# Patient Record
Sex: Male | Born: 1975 | Race: Black or African American | Hispanic: No | Marital: Single | Smoking: Current every day smoker
Health system: Southern US, Community
[De-identification: ages and names within clinical notes are randomized; demographics above are authoritative.]

---

## 2018-09-04 ENCOUNTER — Encounter (HOSPITAL_COMMUNITY): Payer: Self-pay | Admitting: Pharmacy Technician

## 2018-09-04 ENCOUNTER — Emergency Department (HOSPITAL_COMMUNITY)
Admission: EM | Admit: 2018-09-04 | Discharge: 2018-09-04 | Disposition: A | Discharge status: Home / Self Care | Payer: Medicaid Other | Attending: Emergency Medicine | Admitting: Emergency Medicine

## 2018-09-04 ENCOUNTER — Emergency Department (HOSPITAL_COMMUNITY): Payer: Medicaid Other

## 2018-09-04 ENCOUNTER — Emergency Department (HOSPITAL_COMMUNITY)
Admission: EM | Admit: 2018-09-04 | Discharge: 2018-09-05 | Disposition: A | Discharge status: Home / Self Care | Payer: Medicaid Other | Source: Home / Self Care | Attending: Emergency Medicine | Admitting: Emergency Medicine

## 2018-09-04 DIAGNOSIS — R45851 Suicidal ideations: Secondary | ICD-10-CM | POA: Insufficient documentation

## 2018-09-04 DIAGNOSIS — R079 Chest pain, unspecified: Secondary | ICD-10-CM | POA: Diagnosis present

## 2018-09-04 DIAGNOSIS — Z046 Encounter for general psychiatric examination, requested by authority: Secondary | ICD-10-CM

## 2018-09-04 DIAGNOSIS — F329 Major depressive disorder, single episode, unspecified: Secondary | ICD-10-CM

## 2018-09-04 DIAGNOSIS — Z79899 Other long term (current) drug therapy: Secondary | ICD-10-CM

## 2018-09-04 LAB — CBC WITH DIFFERENTIAL/PLATELET
Abs Immature Granulocytes: 0.03 10*3/uL (ref 0.00–0.07)
Basophils Absolute: 0.1 10*3/uL (ref 0.0–0.1)
Basophils Relative: 1 %
Eosinophils Absolute: 0.9 10*3/uL — ABNORMAL HIGH (ref 0.0–0.5)
Eosinophils Relative: 8 %
HCT: 43 % (ref 39.0–52.0)
Hemoglobin: 13.3 g/dL (ref 13.0–17.0)
Immature Granulocytes: 0 %
Lymphocytes Relative: 31 %
Lymphs Abs: 3.3 10*3/uL (ref 0.7–4.0)
MCH: 28.1 pg (ref 26.0–34.0)
MCHC: 30.9 g/dL (ref 30.0–36.0)
MCV: 90.7 fL (ref 80.0–100.0)
Monocytes Absolute: 1.1 10*3/uL — ABNORMAL HIGH (ref 0.1–1.0)
Monocytes Relative: 11 %
Neutro Abs: 5.3 10*3/uL (ref 1.7–7.7)
Neutrophils Relative %: 49 %
Platelets: 412 10*3/uL — ABNORMAL HIGH (ref 150–400)
RBC: 4.74 MIL/uL (ref 4.22–5.81)
RDW: 13.2 % (ref 11.5–15.5)
WBC: 10.7 10*3/uL — ABNORMAL HIGH (ref 4.0–10.5)
nRBC: 0 % (ref 0.0–0.2)

## 2018-09-04 LAB — COMPREHENSIVE METABOLIC PANEL
ALT: 27 U/L (ref 0–44)
AST: 24 U/L (ref 15–41)
Albumin: 3.8 g/dL (ref 3.5–5.0)
Alkaline Phosphatase: 58 U/L (ref 38–126)
Anion gap: 11 (ref 5–15)
BUN: 10 mg/dL (ref 6–20)
CO2: 28 mmol/L (ref 22–32)
Calcium: 9.5 mg/dL (ref 8.9–10.3)
Chloride: 100 mmol/L (ref 98–111)
Creatinine, Ser: 1.03 mg/dL (ref 0.61–1.24)
GFR calc Af Amer: >60 mL/min (ref >60)
Glucose, Bld: 83 mg/dL (ref 70–99)
Potassium: 3.6 mmol/L (ref 3.5–5.1)
Sodium: 139 mmol/L (ref 135–145)
Total Bilirubin: 0.3 mg/dL (ref 0.3–1.2)
Total Protein: 7.1 g/dL (ref 6.5–8.1)

## 2018-09-04 LAB — CBC
HCT: 42.9 % (ref 39.0–52.0)
Hemoglobin: 13.3 g/dL (ref 13.0–17.0)
MCH: 28.1 pg (ref 26.0–34.0)
MCHC: 31 g/dL (ref 30.0–36.0)
MCV: 90.7 fL (ref 80.0–100.0)
PLATELETS: 442 10*3/uL — AB (ref 150–400)
RBC: 4.73 MIL/uL (ref 4.22–5.81)
RDW: 13.2 % (ref 11.5–15.5)
WBC: 11.2 10*3/uL — ABNORMAL HIGH (ref 4.0–10.5)
nRBC: 0 % (ref 0.0–0.2)

## 2018-09-04 LAB — BASIC METABOLIC PANEL
Anion gap: 14 (ref 5–15)
BUN: 10 mg/dL (ref 6–20)
CO2: 24 mmol/L (ref 22–32)
Calcium: 9.7 mg/dL (ref 8.9–10.3)
Chloride: 101 mmol/L (ref 98–111)
Creatinine, Ser: 0.95 mg/dL (ref 0.61–1.24)
GFR calc Af Amer: >60 mL/min (ref >60)
GFR calc non Af Amer: >60 mL/min (ref >60)
Glucose, Bld: 93 mg/dL (ref 70–99)
Potassium: 4.6 mmol/L (ref 3.5–5.1)
Sodium: 139 mmol/L (ref 135–145)

## 2018-09-04 LAB — ACETAMINOPHEN LEVEL: Acetaminophen (Tylenol), Serum: <10 ug/mL — ABNORMAL LOW (ref 10–30)

## 2018-09-04 LAB — RAPID URINE DRUG SCREEN, HOSP PERFORMED
Amphetamines: NOT DETECTED
Barbiturates: NOT DETECTED
Benzodiazepines: NOT DETECTED
Cocaine: POSITIVE — AB
OPIATES: NOT DETECTED
Tetrahydrocannabinol: NOT DETECTED

## 2018-09-04 LAB — SALICYLATE LEVEL

## 2018-09-04 LAB — ETHANOL

## 2018-09-04 LAB — TROPONIN I: Troponin I: <0.03 ng/mL (ref <0.03)

## 2018-09-04 MED ORDER — QUETIAPINE FUMARATE 50 MG PO TABS
300.0000 mg | ORAL_TABLET | Freq: Every day | ORAL | Status: DC
  Administered 2018-09-05: 300 mg via ORAL
  Filled 2018-09-04: qty 1

## 2018-09-04 MED ORDER — HALOPERIDOL 5 MG PO TABS
10.0000 mg | ORAL_TABLET | Freq: Every day | ORAL | Status: DC
  Administered 2018-09-05: 10 mg via ORAL
  Filled 2018-09-04: qty 2

## 2018-09-04 MED ORDER — HYDROXYZINE HCL 25 MG PO TABS
25.0000 mg | ORAL_TABLET | Freq: Four times a day (QID) | ORAL | Status: DC | PRN
  Administered 2018-09-05: 25 mg via ORAL
  Filled 2018-09-04: qty 1

## 2018-09-04 MED ORDER — BENZTROPINE MESYLATE 1 MG PO TABS
1.0000 mg | ORAL_TABLET | Freq: Two times a day (BID) | ORAL | Status: DC
  Administered 2018-09-05 (×2): 1 mg via ORAL
  Filled 2018-09-04 (×2): qty 1

## 2018-09-04 MED ORDER — NALTREXONE HCL 50 MG PO TABS
50.0000 mg | ORAL_TABLET | Freq: Every day | ORAL | Status: DC
  Administered 2018-09-05: 50 mg via ORAL
  Filled 2018-09-04: qty 1

## 2018-09-04 NOTE — ED Notes (Signed)
Patient transported to X-ray 

## 2018-09-04 NOTE — ED Provider Notes (Signed)
[facility] EMERGENCY DEPARTMENT Provider Note   CSN: [id number] Arrival date & time: 09/04/18  1611     History   Chief Complaint Chief Complaint  Patient presents with  . Chest Pain    HPI Anthony Cunningham is a 42 y.o. male.  HPI  42 year old male with chest pain.  Onset about an hour prior to arrival.  Patient states that he recently relocated from [facility] [place] to get his medications.  He was seated when he began having tightness in his chest like he has difficulty moving air.  He says the sensation was reminiscent of symptoms he had with bronchitis when he was younger.  Symptoms have since resolved.  He was dyspneic when this is going on.  No diaphoresis.  No palpitations.  He felt fine earlier in the day.  No history of any cardiac disease that he is aware of.  History reviewed. No pertinent past medical history.  There are no active problems to display for this patient.   History reviewed. No pertinent surgical history.      Home Medications    Prior to Admission medications   Not on File    Family History No family history on file.  Social History Social History   Tobacco Use  . Smoking status: Not on file  Substance Use Topics  . Alcohol use: Not on file  . Drug use: Not on file     Allergies   Ibuprofen   Review of Systems Review of Systems  All systems reviewed and negative, other than as noted in HPI.  Physical Exam Updated Vital Signs BP 113/68 (BP Location: Right Arm)   Pulse 64   Temp 98.4 F (36.9 C) (Oral)   Resp 16   Ht 5\' 9"  (1.753 m)   Wt 73 kg   SpO2 98%   BMI 23.78 kg/m   Physical Exam Vitals signs and nursing note reviewed.  Constitutional:      General: He is not in acute distress.    Appearance: He is well-developed.  HENT:     Head: Normocephalic and atraumatic.  Eyes:     General:        Right eye: No discharge.        Left eye: No discharge.     Conjunctiva/sclera: Conjunctivae  normal.  Neck:     Musculoskeletal: Neck supple.  Cardiovascular:     Rate and Rhythm: Normal rate and regular rhythm.     Heart sounds: Normal heart sounds. No murmur. No friction rub. No gallop.   Pulmonary:     Effort: Pulmonary effort is normal. No respiratory distress.     Breath sounds: Normal breath sounds.  Abdominal:     General: There is no distension.     Palpations: Abdomen is soft.     Tenderness: There is no abdominal tenderness.  Musculoskeletal:        General: No tenderness.     Comments: Lower extremities symmetric as compared to each other. No calf tenderness. Negative Homan's. No palpable cords.   Skin:    General: Skin is warm and dry.  Neurological:     Mental Status: He is alert.  Psychiatric:        Behavior: Behavior normal.        Thought Content: Thought content normal.      ED Treatments / Results  Labs (all labs ordered are listed, but only abnormal results are displayed) Labs Reviewed  CBC WITH DIFFERENTIAL/PLATELET -  Abnormal; Notable for the following components:      Result Value   WBC 10.7 (*)    Platelets 412 (*)    Monocytes Absolute 1.1 (*)    Eosinophils Absolute 0.9 (*)    All other components within normal limits  BASIC METABOLIC PANEL  TROPONIN I    EKG EKG Interpretation  Date/Time:  Friday September 04 2018 16:16:29 EST Ventricular Rate:  63 PR Interval:    QRS Duration: 94 QT Interval:  387 QTC Calculation: 397 R Axis:   68 Text Interpretation:  Sinus rhythm RSR' in V1 or V2, right VCD or RVH No old tracing to compare Confirmed by Raeford RazorKohut, Grizelda Piscopo [phone]) on 09/04/2018 4:21:26 PM   Radiology Dg Chest 2 View  Result Date: 09/04/2018 CLINICAL DATA:  Chest pain. EXAM: CHEST - 2 VIEW COMPARISON:  None. FINDINGS: The heart size and mediastinal contours are within normal limits. Both lungs are clear. The visualized skeletal structures are unremarkable. IMPRESSION: No active cardiopulmonary disease. Electronically Signed    By: Gerome Samavid  Williams III M.D   On: 09/04/2018 16:57    Procedures Procedures (including critical care time)  Medications Ordered in ED Medications - No data to display   Initial Impression / Assessment and Plan / ED Course  I have reviewed the triage vital signs and the nursing notes.  Pertinent labs & imaging results that were available during my care of the patient were reviewed by me and considered in my medical decision making (see chart for details).    [age] year old male with chest pain.  Seems atypical for ACS.  Doubt PE, dissection or other emergent process.  EKG pretty unremarkable.  Chest x-ray clear.  Basic labs/troponin normal.  It has been determined that no acute conditions requiring further emergency intervention are present at this time. The patient has been advised of the diagnosis and plan. I reviewed any labs and imaging including any potential incidental findings. We have discussed signs and symptoms that warrant return to the ED and they are listed in the discharge instructions.    Final Clinical Impressions(s) / ED Diagnoses   Final diagnoses:  Chest pain, unspecified type    ED Discharge Orders    None       Raeford RazorKohut, Annebelle Bostic, MD 09/04/18 [phone]

## 2018-09-04 NOTE — ED Notes (Signed)
Patient verbalizes understanding of discharge instructions. Opportunity for questioning and answers were provided. 

## 2018-09-04 NOTE — ED Provider Notes (Signed)
[facility] Provider Note   CSN: [id number] Arrival date & time: 09/04/18  1950     History   Chief Complaint Chief Complaint  Patient presents with  . Suicidal    HPI Anthony Cunningham is a 42 y.o. male.  The history is provided by the patient and medical records.     42 y.o. M with hx of psychiatric illness, presenting to the ED for suicidal ideation.  Patient reports this has been on and off for a few weeks but worse today.  States he has recently had to move and states that other factors in his life just seem to be "going all wrong".  He states he has been having thoughts of sitting in the road to get hit by a car.  He reports SI attempt several years ago, none recently.  He has been compliant with this medications---states initially they were helping but don't seem to help as much anymore.  He denies HI/AVH.  Denies recent drug or alcohol abuse.    No past medical history on file.  There are no active problems to display for this patient.   No past surgical history on file.      Home Medications    Prior to Admission medications   Medication Sig Start Date End Date Taking? Authorizing Provider  benztropine (COGENTIN) 1 MG tablet Take 1 mg by mouth 2 (two) times daily.   Yes [provider]  haloperidol (HALDOL) 10 MG tablet Take 10 mg by mouth at bedtime.   Yes [provider]  hydrOXYzine (VISTARIL) 25 MG capsule Take 25 mg by mouth every 6 (six) hours as needed for anxiety.   Yes [provider]  naltrexone (DEPADE) 50 MG tablet Take 50 mg by mouth daily.   Yes [provider]  QUEtiapine (SEROQUEL) 300 MG tablet Take 300 mg by mouth at bedtime.   Yes [provider]    Family History No family history on file.  Social History Social History   Tobacco Use  . Smoking status: Not on file  Substance Use Topics  . Alcohol use: Not on file  . Drug use: Not on file     Allergies    Ibuprofen   Review of Systems Review of Systems  Psychiatric/Behavioral: Positive for suicidal ideas.  All other systems reviewed and are negative.    Physical Exam Updated Vital Signs BP 117/78 (BP Location: Left Arm)   Pulse 78   Temp 98.2 F (36.8 C) (Oral)   Resp 16   SpO2 100%   Physical Exam Vitals signs and nursing note reviewed.  Constitutional:      Appearance: He is well-developed.  HENT:     Head: Normocephalic and atraumatic.  Eyes:     Conjunctiva/sclera: Conjunctivae normal.     Pupils: Pupils are equal, round, and reactive to light.  Neck:     Musculoskeletal: Normal range of motion.  Cardiovascular:     Rate and Rhythm: Normal rate and regular rhythm.     Heart sounds: Normal heart sounds.  Pulmonary:     Effort: Pulmonary effort is normal.     Breath sounds: Normal breath sounds.  Abdominal:     General: Bowel sounds are normal.     Palpations: Abdomen is soft.  Musculoskeletal: Normal range of motion.  Skin:    General: Skin is warm and dry.  Neurological:     Mental Status: He is alert and oriented to person, place,  and time.  Psychiatric:        Attention and Perception: He does not perceive auditory hallucinations.        Thought Content: Thought content includes suicidal ideation. Thought content does not include homicidal ideation. Thought content includes suicidal plan. Thought content does not include homicidal plan.      ED Treatments / Results  Labs (all labs ordered are listed, but only abnormal results are displayed) Labs Reviewed  ACETAMINOPHEN LEVEL - Abnormal; Notable for the following components:      Result Value   Acetaminophen (Tylenol), Serum <10 (*)    All other components within normal limits  CBC - Abnormal; Notable for the following components:   WBC 11.2 (*)    Platelets 442 (*)    All other components within normal limits  RAPID URINE DRUG SCREEN, HOSP PERFORMED - Abnormal; Notable for the following  components:   Cocaine POSITIVE (*)    All other components within normal limits  COMPREHENSIVE METABOLIC PANEL  ETHANOL  SALICYLATE LEVEL    EKG None  Radiology Dg Chest 2 View  Result Date: 09/04/2018 CLINICAL DATA:  Chest pain. EXAM: CHEST - 2 VIEW COMPARISON:  None. FINDINGS: The heart size and mediastinal contours are within normal limits. Both lungs are clear. The visualized skeletal structures are unremarkable. IMPRESSION: No active cardiopulmonary disease. Electronically Signed   By: Gerome Sam III M.D   On: 09/04/2018 16:57    Procedures Procedures (including critical care time)  Medications Ordered in ED Medications  benztropine (COGENTIN) tablet 1 mg (1 mg Oral Given 09/05/18 0005)  haloperidol (HALDOL) tablet 10 mg (10 mg Oral Given 09/05/18 0005)  hydrOXYzine (ATARAX/VISTARIL) tablet 25 mg (25 mg Oral Given 09/05/18 0236)  QUEtiapine (SEROQUEL) tablet 300 mg (300 mg Oral Given 09/05/18 0005)  naltrexone (DEPADE) tablet 50 mg (has no administration in time range)     Initial Impression / Assessment and Plan / ED Course  I have reviewed the triage vital signs and the nursing notes.  Pertinent labs & imaging results that were available during my care of the patient were reviewed by me and considered in my medical decision making (see chart for details).  42 year old male here with suicidal ideation.  Reports he has been depressed recently and is now having thoughts of sitting in traffic in hopes of being killed by a car.  Does report history of suicide attempt in the past.  States he is under a lot of stress and a lot of things in his life are "going all wrong".  He denies any homicidal ideation.  No hallucinations.  Denies any significant substance abuse.  Labs are overall reassuring, UDS is positive for cocaine.  Patient medically cleared.  Awaiting TTS evaluation.  TTS has evaluated.  Recommends overnight observation and reassessment by psychiatry in the  morning.  Home meds have been ordered.  Patient has been resting comfortably, no acute events.  Final Clinical Impressions(s) / ED Diagnoses   Final diagnoses:  Suicidal ideation    ED Discharge Orders    None       Garlon Hatchet, PA-C 09/05/18 1610    Blane Ohara, MD 09/06/18 [phone]

## 2018-09-04 NOTE — ED Notes (Signed)
No sitter available per staffing office.  

## 2018-09-04 NOTE — ED Triage Notes (Signed)
Pt complaining of thoughts of wanting to harm himself and others. States he does has have a plan for attempting suicide. Says he's been having these thoughts for several days. Has a history of attempting suicide before

## 2018-09-04 NOTE — ED Triage Notes (Signed)
[facility] of sudden onset of CP approx 1 hour ago. 115/74, RR 18, HR 60. Pt with recent move here. Reports increased stress levels. 324mg  ASA and 1 SL nitroglycerin given by ems.

## 2018-09-05 ENCOUNTER — Encounter: Payer: Self-pay | Admitting: Medical

## 2018-09-05 ENCOUNTER — Encounter (HOSPITAL_COMMUNITY): Payer: Self-pay

## 2018-09-05 ENCOUNTER — Other Ambulatory Visit: Payer: Self-pay

## 2018-09-05 ENCOUNTER — Emergency Department (HOSPITAL_COMMUNITY)
Admission: EM | Admit: 2018-09-05 | Discharge: 2018-09-07 | Disposition: A | Discharge status: Home / Self Care | Payer: Medicaid Other | Attending: Psychiatry | Admitting: Psychiatry

## 2018-09-05 DIAGNOSIS — F329 Major depressive disorder, single episode, unspecified: Secondary | ICD-10-CM | POA: Insufficient documentation

## 2018-09-05 DIAGNOSIS — Z046 Encounter for general psychiatric examination, requested by authority: Secondary | ICD-10-CM | POA: Diagnosis not present

## 2018-09-05 DIAGNOSIS — R45851 Suicidal ideations: Secondary | ICD-10-CM | POA: Diagnosis not present

## 2018-09-05 DIAGNOSIS — F1420 Cocaine dependence, uncomplicated: Secondary | ICD-10-CM | POA: Insufficient documentation

## 2018-09-05 DIAGNOSIS — F1994 Other psychoactive substance use, unspecified with psychoactive substance-induced mood disorder: Secondary | ICD-10-CM | POA: Insufficient documentation

## 2018-09-05 DIAGNOSIS — F17200 Nicotine dependence, unspecified, uncomplicated: Secondary | ICD-10-CM | POA: Insufficient documentation

## 2018-09-05 DIAGNOSIS — F419 Anxiety disorder, unspecified: Secondary | ICD-10-CM | POA: Insufficient documentation

## 2018-09-05 DIAGNOSIS — R441 Visual hallucinations: Secondary | ICD-10-CM | POA: Diagnosis not present

## 2018-09-05 DIAGNOSIS — Z8659 Personal history of other mental and behavioral disorders: Secondary | ICD-10-CM | POA: Insufficient documentation

## 2018-09-05 DIAGNOSIS — F209 Schizophrenia, unspecified: Secondary | ICD-10-CM | POA: Insufficient documentation

## 2018-09-05 DIAGNOSIS — R45850 Homicidal ideations: Secondary | ICD-10-CM | POA: Insufficient documentation

## 2018-09-05 DIAGNOSIS — Z79899 Other long term (current) drug therapy: Secondary | ICD-10-CM | POA: Insufficient documentation

## 2018-09-05 MED ORDER — NALTREXONE HCL 50 MG PO TABS
50.0000 mg | ORAL_TABLET | Freq: Every day | ORAL | Status: DC
  Administered 2018-09-05 – 2018-09-07 (×2): 50 mg via ORAL
  Filled 2018-09-05 (×4): qty 1

## 2018-09-05 MED ORDER — HYDROXYZINE HCL 25 MG PO TABS
25.0000 mg | ORAL_TABLET | Freq: Four times a day (QID) | ORAL | Status: DC | PRN
  Administered 2018-09-05: 25 mg via ORAL
  Filled 2018-09-05: qty 1

## 2018-09-05 MED ORDER — QUETIAPINE FUMARATE 50 MG PO TABS
300.0000 mg | ORAL_TABLET | Freq: Every day | ORAL | Status: DC
  Administered 2018-09-05 – 2018-09-06 (×2): 300 mg via ORAL
  Filled 2018-09-05 (×2): qty 2

## 2018-09-05 MED ORDER — BENZTROPINE MESYLATE 1 MG PO TABS
1.0000 mg | ORAL_TABLET | Freq: Two times a day (BID) | ORAL | Status: DC
  Administered 2018-09-05 – 2018-09-07 (×4): 1 mg via ORAL
  Filled 2018-09-05 (×4): qty 1

## 2018-09-05 MED ORDER — HALOPERIDOL 5 MG PO TABS
10.0000 mg | ORAL_TABLET | Freq: Every day | ORAL | Status: DC
  Administered 2018-09-05 – 2018-09-06 (×2): 10 mg via ORAL
  Filled 2018-09-05 (×2): qty 2

## 2018-09-05 MED ORDER — HYDROXYZINE PAMOATE 25 MG PO CAPS: 25.0000 mg | ORAL_CAPSULE | Freq: Four times a day (QID) | ORAL | Status: DC | PRN

## 2018-09-05 NOTE — ED Notes (Signed)
Telepsych being performed. 

## 2018-09-05 NOTE — ED Notes (Signed)
Pt woke - ate breakfast. Aware NP, [facility], will be assessing him later today. Pt states he is chronically SI. States has attempted to hurt himself in past. Pt lying on stretcher watching tv. Voiced understanding of Medical Clearance Pt Policy.
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
## 2018-09-05 NOTE — BH Assessment (Signed)
Tele Assessment Note   Patient Name: Anthony Cunningham MRN: [id number] Referring Physician: DR. Jodi MourningZavitz Location of Patient: [facility] H014C [facility]  Anthony Sohooby Malay is an 42 y.o., single male. [facility]. Pt reports having SI with a plan to sit in traffic. Pt seemed very nonchalant and was the only historian for this assessment. Pt reports having tried to commit suicide 7 times in the past. Pt endorses the following symptoms: feelings of hopelessness, despondence, loss of interest, isolation and trouble sleeping. Pt reports poor appetite. Pt denied AH, but reports that he sees spots. Pt reports no use of substances in 2 /months, but pt is positive for Cocaine. Pt reports being prescribed Haldol, Seroquel, Trazodone and Depakote by a previous doctor. [facility]. Pt reports having completed an assessment, as well as saw a doctor for MM. Pt denies HI.   Pt states that he lives in a rooming house that he moved in 2 days ago. Pt reports receiving MCD and SSI. Pt reports having no supports in this county. [place]. Pt reports no pending charges or probation. Pt reports no major medical issues.   Pt oriented to person, place, time and situation. Pt presented mostly alert, dressed appropriately in scrubs and groomed. Pt spoke with slurred speech. Pt did seem to be under the influence of any substances. Pt made decemt eye contact and answered questions appropriately. Pt presented with flat affect, and was calm and open to the assessment process. Pt presented with no impairments of remote or recent memory that could be detected seeing that he was the only historian.   Diagnosis: Per History:  F20.9 Schizophrenia F14.20 Cocaine use disorder, Moderate  Past Medical History: No past medical history on file.  No past surgical history on file.  Family History: No family history on  file.  Social History:  has no history on file for tobacco, alcohol, and drug.  Additional Social History:  Alcohol / Drug Use Pain Medications: SEE MAR.  Prescriptions: Pt reports being prescribed seroquel, haldol, depakote, trazodone.  Over the Counter: SEE MAR.  History of alcohol / drug use?: Yes Longest period of sobriety (when/how long): Pt reports 2 months.  Substance #1 Name of Substance 1: (S) Cocaine (PT denies use in 2 months, but is positive on UDS. )  CIWA: CIWA-Ar BP: 117/78 Pulse Rate: 78 COWS:    Allergies:  Allergies  Allergen Reactions  . Ibuprofen Hives    Home Medications: (Not in a hospital admission)   OB/GYN Status:  No LMP for male patient./  General Assessment Data [facility] TTS Assessment: In system Is this a Tele or Face-to-Face Assessment?: Tele Assessment Is this an Initial Assessment or a Re-assessment for this encounter?: Initial Assessment Patient Accompanied by:: N/A(Pt alone. ) Language Other than English: No Living Arrangements: Other (Comment)(Pt reports living in a rooming house. ) What gender do you identify as?: Male Marital status: Single Maiden name: N/A Pregnancy Status: No Living Arrangements: Non-relatives/Friends Can pt return to current living arrangement?: Yes Admission Status: Voluntary Is patient capable of signing voluntary admission?: Yes Referral Source: Self/Family/Friend Insurance type: Medicaid   Medical Screening Exam [facility] Walk-in ONLY) Medical Exam completed: Yes  Crisis Care Plan Living Arrangements: Non-relatives/Friends [facility]  [facility]   Education Status Is patient currently in school?: No Is the patient employed, unemployed or receiving disability?: Receiving disability  income  Risk to self with the past 6 months Suicidal Ideation: Yes-Currently Present Has patient been a risk to self within the past 6 months prior to admission? :  Yes Suicidal Intent: Yes-Currently Present Has patient had any suicidal intent within the past 6 months prior to admission? : Yes Is patient at risk for suicide?: Yes Suicidal Plan?: Yes-Currently Present Has patient had any suicidal plan within the past 6 months prior to admission? : Yes Specify Current Suicidal Plan: "Sit in traffic."  Access to Means: Yes Specify Access to Suicidal Means: Pt reports having access to roadways.  What has been your use of drugs/alcohol within the last 12 months?: Pt denies use in 2 weeks.  Previous Attempts/Gestures: Yes How many times?: 7 Other Self Harm Risks: Pt denied.  Triggers for Past Attempts: Unpredictable Intentional Self Injurious Behavior: None Family Suicide History: No Recent stressful life event(s): Other (Comment)(Pt reports recent move from [facility]. ) Persecutory voices/beliefs?: No Depression: Yes Depression Symptoms: Feeling worthless/self pity, Loss of interest in usual pleasures, Fatigue, Despondent Substance abuse history and/or treatment for substance abuse?: Yes Suicide prevention information given to non-admitted patients: Not applicable  Risk to Others within the past 6 months Homicidal Ideation: No Does patient have any lifetime risk of violence toward others beyond the six months prior to admission? : No Thoughts of Harm to Others: No Current Homicidal Intent: No Current Homicidal Plan: No Access to Homicidal Means: No Identified Victim: Denied History of harm to others?: No Assessment of Violence: None Noted Violent Behavior Description: Denied Does patient have access to weapons?: No Criminal Charges Pending?: No Does patient have a court date: No Is patient on probation?: No  Psychosis Hallucinations: Visual("I'm seeing spots." ) Delusions: None noted  Mental Status Report Appearance/Hygiene: In scrubs, Unremarkable Eye Contact: Good Motor Activity: Freedom of movement Speech: Logical/coherent Level  of Consciousness: Quiet/awake Mood: Depressed Affect: Depressed Anxiety Level: None Thought Processes: Coherent, Relevant Judgement: Impaired Orientation: Person, Time, Situation Obsessive Compulsive Thoughts/Behaviors: None  Cognitive Functioning Concentration: Normal Memory: Recent Intact, Remote Intact Is patient IDD: No Insight: Poor Impulse Control: Poor Appetite: Fair Have you had any weight changes? : No Change Sleep: Decreased Total Hours of Sleep: 4 Vegetative Symptoms: None  ADLScreening [facility] Assessment Services) Patient's cognitive ability adequate to safely complete daily activities?: Yes Patient able to express need for assistance with ADLs?: Yes Independently performs ADLs?: Yes (appropriate for developmental age)  Prior Inpatient Therapy Prior Inpatient Therapy: Yes Prior Therapy Dates: 2019 Prior Therapy Facilty/Provider(s): [facility] Reason for Treatment: Psychosis; SI  Prior Outpatient Therapy Prior Outpatient Therapy: Yes Prior Therapy Dates: 2019 Prior Therapy Facilty/Provider(s): [facility]; [place]  Reason for Treatment: SI; Psychosis Does patient have an ACCT team?: No Does patient have Intensive In-House Services?  : No Does patient have [facility] services? : Yes Does patient have P4CC services?: No  ADL Screening (condition at time of admission) Patient's cognitive ability adequate to safely complete daily activities?: Yes Is the patient deaf or have difficulty hearing?: No Does the patient have difficulty seeing, even when wearing glasses/contacts?: No Does the patient have difficulty concentrating, remembering, or making decisions?: No Patient able to express need for assistance with ADLs?: Yes Independently performs ADLs?: Yes (appropriate for developmental age) Does the patient have difficulty walking or climbing stairs?: No Weakness of Legs: None Weakness of Arms/Hands: None  Home Assistive Devices/Equipment Home  Assistive Devices/Equipment: None  Therapy Consults (therapy consults require a physician order) PT Evaluation Needed: No OT  Evalulation Needed: No SLP Evaluation Needed: No Abuse/Neglect Assessment (Assessment to be complete while patient is alone) Abuse/Neglect Assessment Can Be Completed: Yes Physical Abuse: Denies Verbal Abuse: Denies Sexual Abuse: Denies Exploitation of patient/patient's resources: Denies Self-Neglect: Denies Values / Beliefs Cultural Requests During Hospitalization: None Consults Spiritual Care Consult Needed: No Social Work Consult Needed: No Merchant navy officer (For Healthcare) Does Patient Have a Medical Advance Directive?: No Would patient like information on creating a medical advance directive?: No - Patient declined          Disposition: Per Donell Sievert, PA; Pt to be held for observation and stabilization due to SI with a plan. Pt to be evaluated by psychiatry in the morning.  Disposition Initial Assessment Completed for this Encounter: Yes  This service was provided via telemedicine using a 2-way, interactive audio and video technology.  Names of all persons participating in this telemedicine service and their role in this encounter. Name: Caedin Mogan Role: Patient  Name: Chesley Noon Role: Clinician   Name:  Role:   Name:  Role:    Chesley Noon, M.S., [facility], LCAS Triage Specialist [facility]  09/05/2018 1:09 AM

## 2018-09-05 NOTE — ED Notes (Addendum)
ALL belonigings - 1 large black suitcase and 2 labeled bags - returned to pt - Pt verified all items present. Security escorted pt from ED. Pt given bus pass and resources - outpt and homeless shelters.

## 2018-09-05 NOTE — ED Notes (Signed)
Pt aware being d/c'd - snack given as requested.

## 2018-09-05 NOTE — Progress Notes (Signed)
Telepsych Revaluation  HPI 42 y/o disabled Cocaine abusing BM seen at [facility] 12/13  for MM after relocating from [facility].Pt c/o chest pain and was sent to [facility] where he c/o SI.Tele Assessed by Coralie Carpen Counselor and Donell Sievert PA: Disposition: Per Donell Sievert, PA; Pt to be held for observation and stabilization due to SI with a plan. Pt to be evaluated by psychiatry in the morning. Troponin level  and EKG WNL. Disposition Initial Assessment Completed for this Encounter: Yes This service was provided via telemedicine using a 2-way, interactive audio and video technology. On this Teleassessment pt is seen lying in bed 049C. He reports feeling nonsuicidal this AM and wants to return to Shelter. He say he has medications. He says he is interester in further SA treatment for his Cocaine problem. Did have 30 day inpatient treatment in past in [facility] as weel as Psychiatric care for hx of Schizophrenia. PMH;PPSYCH Hx;Fam Hx;SH- per [facility] assessment /no records in Care Everywhere Past Psych Meds: Pt reports being prescribed seroquel, haldol, depakote, trazodone Allergies:      Allergies  Allergen Reactions  . Ibuprofen Hives   Psychiatric Specialty Exam: ROS ROS CONSTITUTIONAL:  NO Fever, Chills, Loss of Sleep, Fatigue, Generalized Weakness and Poor Appetite  EYES:NO Change in Vision, Double Vision, Blurred Vision and Tearing  EARS/NOSE/THROAT: NO Hearing Loss, Ear Infections, Ear Drainage, Ringing in Ears, Ear Pain, Runny Nose, Nose Bleeding Has Dental Problems  HEART: NO Palpitations and Chest Pain resolved  LUNG: NO Wheezing, Shortness of Breath  and Coughing    STOMACH/BOWEL: NO Nausea, Vomiting, Heartburn, Reflux, Change in stool habits, Diarrhea  , Constipation, Change in Color Stool  and Abdominal Pain  GENITOURINARY:NO Incontinence, Pain with Urination, Frequency, Urgency, Urinary Tract Infections and  Blood in Urine  SKIN: NO Rash, Dryness, Hyperpigmentation and Pruritus  MUSCLE/BONES: NO Back Pain, Joint Pain and Difficulties Walking  NERVOUS SYSTEM:NO Seizure, Headaches, Dizziness, Vertigo, Blurred Vision, Paralysis  , Weakness and Numbness  HORMONES/REPRODUCTIVE:NO Diabetes and Thyroid Problem  BLOOD/LYMPH SYSTEM:NO Easy bruising/bleeding;Swollen glands  IMMUNE: NO Steroid Use and Immune Disorder  Psychiatric: Agitation: Negative Hallucination: Negative Depressed Mood: Chronic  Affect:Flat Thought: Coherent Logical;Denies SI HI Insomnia: rx Trazodone history Hypersomnia: Negative Altered Concentration: Negative Feels Worthless: No Grandiose Ideas: Negative Belief In Special Powers: Negative New/Increased Substance Abuse: Yes Compulsions: Cocaine related     Height 5' 6.25" (1.683 m), weight 255 lb (115.7 kg).Body mass index is 40.85 kg/m.  General Appearance: Alert ;lying in ED bed in hospital clothing  Eye Contact:  Good  Speech:  Clear and Coherent  Volume:  Normal  Mood:  Euthymic  Affect:  Flat  Thought Process:  Coherent and Descriptions of Associations: Intact  Orientation:  Full (Time, Place, and Person)  Thought Content: Logical and Denying extent of cocaine use   Suicidal Thoughts:  No  Homicidal Thoughts:  No  Memory:  Negative  Judgement:  Impaired  Insight:  Lacking  Psychomotor Activity:  Normal  Concentration:  Concentration: Good and Attention Span: Good  Recall:  Good  Fund of Knowledge: Good  Language: Good  Akathisia:  Negative  Handed:  Right  AIMS (if indicated): not done  Assets:  Desire for Improvement Financial Resources/Insurance Housing Resilience  ADL's:  Intact  Cognition: Impaired,  Moderate cocaine use  Sleep:  Hx of trazodone    Assessment :Episode of Cocaine induced mood disorder with SI on top of Hx of schizophrenia/depression.After overnite observation the mood/SI  have passed  Plan: Discharge with OP resources  and FU [facility] as scheduled

## 2018-09-05 NOTE — ED Notes (Signed)
Pt arrived to Rm 49 via stretcher. Pt noted to be wearing burgundy scrubs. Pt noted to be sleeping - eyes closed, respirations even, unlabored. Nad. Sitter w/pt.

## 2018-09-05 NOTE — ED Triage Notes (Signed)
Pt arrives for eval of "mental breakdown". Pt denies SI to this Clinical research associatewriter and MD, denies AVH, endorses he feels that if "people make him angry he might hurt them". Denies active HI. Endorses worsening anxiety.  Pt reports he feels helpless. Pt reports that he is living at a shelter.

## 2018-09-05 NOTE — ED Notes (Signed)
Pt given meal. No sharps or hazardous materials on tray prior to pt receiving it.

## 2018-09-05 NOTE — Discharge Instructions (Addendum)
Please read attached information. If you experience any new or worsening signs or symptoms please return to the emergency room for evaluation. Please follow-up with your primary care provider or specialist as discussed.  °

## 2018-09-05 NOTE — ED Notes (Signed)
Will administer 1000 meds when pt awakens.  

## 2018-09-05 NOTE — ED Provider Notes (Signed)
[facility] EM[facility]ENCY [facility] Provider Note   CSN: [id number] Arrival date & time: 09/05/18  1816     History   Chief Complaint Chief Complaint  Patient presents with  . Psychiatric Evaluation    HPI Anthony Cunningham is a 42 y.o. male.  The history is provided by the patient and medical records. No language interpreter was used.  Mental Health Problem  Presenting symptoms: hallucinations   Presenting symptoms: no aggressive behavior, no agitation, no paranoid behavior, no suicidal thoughts, no suicidal threats and no suicide attempt   Degree of incapacity (severity):  Moderate Onset quality:  Gradual Duration:  1 day Timing:  Constant Progression:  Unchanged Chronicity:  Recurrent Relieved by:  Nothing Worsened by:  Nothing Ineffective treatments:  None tried Associated symptoms: anxiety   Associated symptoms: no chest pain, no fatigue and no headaches   Risk factors: hx of mental illness and hx of suicide attempts     No past medical history on file.  Patient Active Problem List   Diagnosis Date Noted  . Cocaine dependence, continuous abuse (HCC) 09/05/2018  . Substance induced mood disorder (HCC) 09/05/2018  . History of major depression 09/05/2018  . History of schizophrenia 09/05/2018    No past surgical history on file.      Home Medications    Prior to Admission medications   Medication Sig Start Date End Date Taking? Authorizing Provider  benztropine (COGENTIN) 1 MG tablet Take 1 mg by mouth 2 (two) times daily.    [provider]  haloperidol (HALDOL) 10 MG tablet Take 10 mg by mouth at bedtime.    [provider]  hydrOXYzine (VISTARIL) 25 MG capsule Take 25 mg by mouth every 6 (six) hours as needed for anxiety.    [provider]  naltrexone (DEPADE) 50 MG tablet Take 50 mg by mouth daily.    [provider]  QUEtiapine (SEROQUEL) 300 MG tablet Take 300 mg by mouth at bedtime.    [provider]    Family History No family history on file.  Social History Social History   Tobacco Use  . Smoking status: Not on file  Substance Use Topics  . Alcohol use: Not on file  . Drug use: Not on file     Allergies   Ibuprofen   Review of Systems Review of Systems  Constitutional: Negative for chills, fatigue and fever.  HENT: Negative for congestion.   Eyes: Negative for visual disturbance.  Respiratory: Negative for cough, chest tightness, shortness of breath and wheezing.   Cardiovascular: Negative for chest pain, palpitations and leg swelling.  Gastrointestinal: Negative for constipation, diarrhea, nausea and vomiting.  Genitourinary: Negative for flank pain.  Musculoskeletal: Negative for back pain, neck pain and neck stiffness.  Neurological: Negative for headaches.  Psychiatric/Behavioral: Positive for hallucinations. Negative for agitation, confusion, paranoia and suicidal ideas. The patient is nervous/anxious.   All other systems reviewed and are negative.    Physical Exam Updated Vital Signs BP 119/76 (BP Location: Right Arm)   Pulse 79   Temp 97.8 F (36.6 C) (Oral)   Resp 20   Ht 5\' 9"  (1.753 m)   Wt 73 kg   SpO2 98%   BMI 23.77 kg/m   Physical Exam Vitals signs and nursing note reviewed.  Constitutional:      Appearance: He is well-developed.  HENT:     Head: Normocephalic and atraumatic.     Nose: No congestion or rhinorrhea.  Eyes:  Conjunctiva/sclera: Conjunctivae normal.  Neck:     Musculoskeletal: Neck supple.  Cardiovascular:     Rate and Rhythm: Normal rate and regular rhythm.     Heart sounds: No murmur.  Pulmonary:     Effort: Pulmonary effort is normal. No respiratory distress.     Breath sounds: Normal breath sounds.  Abdominal:     Palpations: Abdomen is soft.     Tenderness: There is no abdominal tenderness.  Skin:    General: Skin is warm and dry.     Capillary Refill: Capillary refill takes less than 2  seconds.  Neurological:     General: No focal deficit present.     Mental Status: He is alert and oriented to person, place, and time.  Psychiatric:        Attention and Perception: He perceives visual hallucinations. He does not perceive auditory hallucinations.        Mood and Affect: Mood is anxious.        Speech: He is communicative.        Behavior: Behavior is not agitated or aggressive.        Thought Content: Thought content does not include homicidal or suicidal ideation. Thought content does not include homicidal or suicidal plan.     Comments: Concerned he may hurt others if he sees other people and discharge.      ED Treatments / Results  Labs (all labs ordered are listed, but only abnormal results are displayed) Labs Reviewed - No data to display  EKG None  Radiology Dg Chest 2 View  Result Date: 09/04/2018 CLINICAL DATA:  Chest pain. EXAM: CHEST - 2 VIEW COMPARISON:  None. FINDINGS: The heart size and mediastinal contours are within normal limits. Both lungs are clear. The visualized skeletal structures are unremarkable. IMPRESSION: No active cardiopulmonary disease. Electronically Signed   By: Gerome Sam III M.D   On: 09/04/2018 16:57    Procedures Procedures (including critical care time)  Medications Ordered in ED Medications - No data to display   Initial Impression / Assessment and Plan / ED Course  I have reviewed the triage vital signs and the nursing notes.  Pertinent labs & imaging results that were available during my care of the patient were reviewed by me and considered in my medical decision making (see chart for details).     Anthony Cunningham is a 42 y.o. male with a past medical history significant for schizophrenia and polysubstance abuse who recently was discharged from emergency department several hours ago who presents with recurrent concerns of racing thoughts, anxiety, and thoughts that he might hurt someone else.  He reports that since  discharge several hours ago, he has not taken any drugs, had any alcohol, or use of the medications.  He denies any physical complaints since then.  No chest pain, shortness breath, fevers, chills, urinary symptoms or GI symptoms.  He is only concerned about his recurrent racing thoughts and thinks that he might hurt someone if he is around other people out of the hospital.  He denies any auditory hallucinations.  He occasionally reports he sees symbols in front of him.  He reports anxiety.    On exam, lungs clear chest is nontender.  Abdomen is nontender patient is alert and oriented.  Patient resting comfortably.  Patient was just discharged after being medically cleared, and with lack of any physical complaints, I do not feel patient needs repeat blood testing or imaging.  Patient is still pleased with  medically cleared.  TTS will be reconsulted for management given his recurrent symptoms of racing thoughts anxiety and thinks he may hurt somebody if discharged.  Anticipate following up on TTS recommendations.  7:48 PM Psychiatry evaluated patient and they feel he needs to be held overnight for the psychiatry team to see in the morning.  Patient is medically cleared for further management.   Final Clinical Impressions(s) / ED Diagnoses   Final diagnoses:  Anxiety    Clinical Impression: 1. Anxiety     Disposition: Patient awaiting psychiatric reassessment in the morning after they saw him today and recommended overnight monitoring.  At this time, if patient decides to leave, do not feel he needs to be placed under involuntary commitment as he denied SI and HI.  His biggest complaint was that he "may hurt somebody".  This note was prepared with assistance of Conservation officer, historic buildings. Occasional wrong-word or sound-a-like substitutions may have occurred due to the inherent limitations of voice recognition software.      Keiara Sneeringer, Canary Brim, MD 09/05/18 2036

## 2018-09-05 NOTE — ED Provider Notes (Signed)
42 year old male here with suicidal ideations.  Please see previous providers note for full H&P.  Patient evaluated by behavioral health with recommendations for outpatient follow-up.  Patient agrees to today's plan and feels comfortable with that.  Discharged with strict return precautions.  He had no further questions or concerns at the time discharge.  Vitals:   09/05/18 0608 09/05/18 1328  BP: (!) 96/58 (!) 95/48  Pulse: 73 68  Resp: 16 18  Temp: 98.2 F (36.8 C) 98.1 F (36.7 C)  SpO2: 100% 100%      Eyvonne MechanicHedges, Kailyn Dubie, PA-C 09/05/18 1615    Maia PlanLong, Joshua G, MD 09/05/18 2028

## 2018-09-05 NOTE — Progress Notes (Signed)
Pt nurse, Greig CastillaAndrew informed of pt disposition to hold and have AM psych.

## 2018-09-05 NOTE — ED Notes (Signed)
Ordered bfast tray 

## 2018-09-06 NOTE — ED Notes (Signed)
Sitter ordered d/t pt stated HI - denies SI.

## 2018-09-06 NOTE — ED Notes (Signed)
Joey, SW, aware of consult for SW to assist w/pt getting an ACT team - as per Burnetta Sabin Money, NP, [facility], recommendation.

## 2018-09-06 NOTE — Progress Notes (Signed)
Patient is seen by me via tele-psych.  Patient continues to endorse suicidal ideations and reports that he did have a plan to sit in traffic and hopefully get hit by car.  Patient adamantly denies using any substances at all, even though he was positive for cocaine.  Chart review shows significant amount of documentation for schizoaffective disorder and various medications including Haldol decanoate and Prozac and the patient reports that he has been on Abilify injection as well.  Patient reports that he [facility] approximately 2 weeks ago.  He also [facility], [facility], [facility], [facility], as well as [facility].  He thinks that he has been hospitalized approximately 6 times but then he reports that he has been hospitalized at some of the facilities more than once in the past.  Patient states that he did follow-[facility] and was started back on some medications but they do not seem to be working for him.  We will recommend inpatient treatment to stabilize patient and potentially get patient assisted with ACT team.

## 2018-09-06 NOTE — ED Provider Notes (Signed)
Evaluated patient for ED 8 AM rounds.  RN, Kriste BasqueBecky, reports no changes or difficulties overnight or this morning.  Chart review reveals the same.  Patient initially sleeping.  Easily aroused.  No current complaints.  Patient awaiting reevaluation by psych this morning.  Vitals:   09/05/18 1838 09/05/18 1839 09/06/18 0700 09/06/18 0922  BP:  119/76 98/64 (!) 100/50  Pulse:  79 82 69  Resp:  20 16 15   Temp:  97.8 F (36.6 C) 98.2 F (36.8 C) 98.3 F (36.8 C)  TempSrc:  Oral Oral Oral  SpO2:  98% 100% 100%  Weight: 73 kg     Height: 5\' 9"  (1.753 m)       Physical Exam Vitals signs and nursing note reviewed.  Constitutional:      General: He is not in acute distress.    Appearance: He is well-developed. He is not diaphoretic.  HENT:     Head: Normocephalic and atraumatic.  Eyes:     Conjunctiva/sclera: Conjunctivae normal.  Neck:     Musculoskeletal: Neck supple.  Cardiovascular:     Rate and Rhythm: Normal rate and regular rhythm.     Heart sounds: Normal heart sounds.  Pulmonary:     Effort: Pulmonary effort is normal. No respiratory distress.     Breath sounds: Normal breath sounds.  Abdominal:     Palpations: Abdomen is soft.     Tenderness: There is no abdominal tenderness. There is no guarding.  Lymphadenopathy:     Cervical: No cervical adenopathy.  Skin:    General: Skin is warm and dry.  Neurological:     Mental Status: He is alert.  Psychiatric:        Mood and Affect: Mood normal.        Behavior: Behavior normal.      Abnormal Labs Reviewed - No data to display   Dg Chest 2 View  Result Date: 09/04/2018 CLINICAL DATA:  Chest pain. EXAM: CHEST - 2 VIEW COMPARISON:  None. FINDINGS: The heart size and mediastinal contours are within normal limits. Both lungs are clear. The visualized skeletal structures are unremarkable. IMPRESSION: No active cardiopulmonary disease. Electronically Signed   By: Gerome Samavid  Williams III M.D   On: 09/04/2018 16:57           Joy, Hillard DankerShawn C, PA-C 09/06/18 [id number]    Wynetta FinesMessick, Peter C, MD 09/06/18 [phone]

## 2018-09-06 NOTE — ED Notes (Signed)
Assumed care from [name], [place]RN. Patient stable and cooperative.

## 2018-09-06 NOTE — ED Notes (Signed)
Offered pt to shower x 2 - declined.

## 2018-09-06 NOTE — Progress Notes (Signed)
CSW informed of recommendation to arrange for ACTT services. CSW discussed with RNCM. CSW will continue to follow.   Anthony DelaineJoey Clarivel Callaway, LCSW, LCAS-A Clinical Social Worker II [phone]

## 2018-09-06 NOTE — Progress Notes (Signed)
Patient meets criteria for inpatient treatment. No appropriate or available beds at [facility]. CSW faxed referrals to the following facilities for review:  [facility], [facility], [facility], [address], [facility], [facility], [facility], [facility], [address], [facility], [facility], and [facility].  TTS will continue to seek bed placement.  Vilma MeckelEarl R. Algis GreenhouseForbes, MSW, LCSW Clinical Social Work/Disposition Phone: [phone] Fax: [phone]

## 2018-09-07 ENCOUNTER — Encounter (HOSPITAL_COMMUNITY): Payer: Self-pay | Admitting: Emergency Medicine

## 2018-09-07 ENCOUNTER — Emergency Department (HOSPITAL_COMMUNITY)
Admission: EM | Admit: 2018-09-07 | Discharge: 2018-09-08 | Disposition: A | Discharge status: Other Acute Inpatient Hospital | Payer: Medicaid Other | Source: Home / Self Care | Attending: Emergency Medicine | Admitting: Emergency Medicine

## 2018-09-07 DIAGNOSIS — F17200 Nicotine dependence, unspecified, uncomplicated: Secondary | ICD-10-CM | POA: Insufficient documentation

## 2018-09-07 DIAGNOSIS — F329 Major depressive disorder, single episode, unspecified: Secondary | ICD-10-CM

## 2018-09-07 DIAGNOSIS — R45851 Suicidal ideations: Secondary | ICD-10-CM

## 2018-09-07 DIAGNOSIS — F1420 Cocaine dependence, uncomplicated: Secondary | ICD-10-CM

## 2018-09-07 DIAGNOSIS — R441 Visual hallucinations: Secondary | ICD-10-CM

## 2018-09-07 DIAGNOSIS — Z046 Encounter for general psychiatric examination, requested by authority: Secondary | ICD-10-CM | POA: Insufficient documentation

## 2018-09-07 DIAGNOSIS — Z79899 Other long term (current) drug therapy: Secondary | ICD-10-CM

## 2018-09-07 LAB — CBC
HCT: 39.6 % (ref 39.0–52.0)
HEMOGLOBIN: 12.8 g/dL — AB (ref 13.0–17.0)
MCH: 29 pg (ref 26.0–34.0)
MCHC: 32.3 g/dL (ref 30.0–36.0)
MCV: 89.8 fL (ref 80.0–100.0)
Platelets: 416 10*3/uL — ABNORMAL HIGH (ref 150–400)
RBC: 4.41 MIL/uL (ref 4.22–5.81)
RDW: 12.8 % (ref 11.5–15.5)
WBC: 11.6 10*3/uL — ABNORMAL HIGH (ref 4.0–10.5)
nRBC: 0 % (ref 0.0–0.2)

## 2018-09-07 LAB — RAPID URINE DRUG SCREEN, HOSP PERFORMED
Amphetamines: NOT DETECTED
Barbiturates: NOT DETECTED
Benzodiazepines: NOT DETECTED
Cocaine: NOT DETECTED
Opiates: NOT DETECTED
TETRAHYDROCANNABINOL: NOT DETECTED

## 2018-09-07 LAB — COMPREHENSIVE METABOLIC PANEL
ALT: 30 U/L (ref 0–44)
AST: 23 U/L (ref 15–41)
Albumin: 3.6 g/dL (ref 3.5–5.0)
Alkaline Phosphatase: 62 U/L (ref 38–126)
Anion gap: 9 (ref 5–15)
BILIRUBIN TOTAL: 0.4 mg/dL (ref 0.3–1.2)
BUN: 13 mg/dL (ref 6–20)
CO2: 25 mmol/L (ref 22–32)
Calcium: 9.3 mg/dL (ref 8.9–10.3)
Chloride: 105 mmol/L (ref 98–111)
Creatinine, Ser: 1.03 mg/dL (ref 0.61–1.24)
GFR calc Af Amer: >60 mL/min (ref >60)
GFR calc non Af Amer: >60 mL/min (ref >60)
GLUCOSE: 111 mg/dL — AB (ref 70–99)
Potassium: 4.3 mmol/L (ref 3.5–5.1)
Sodium: 139 mmol/L (ref 135–145)
TOTAL PROTEIN: 7.3 g/dL (ref 6.5–8.1)

## 2018-09-07 LAB — ACETAMINOPHEN LEVEL: Acetaminophen (Tylenol), Serum: <10 ug/mL — ABNORMAL LOW (ref 10–30)

## 2018-09-07 LAB — SALICYLATE LEVEL: Salicylate Lvl: <7 mg/dL (ref 2.8–30.0)

## 2018-09-07 LAB — ETHANOL: Alcohol, Ethyl (B): <10 mg/dL (ref <10)

## 2018-09-07 NOTE — ED Triage Notes (Signed)
Pt reports he is suicidal, "I don't have any hope, I have nothing to live for."  Pt reports he "has not been right since they gave me too shots at that other hospital."  Pt reports paranoid thoughts that people are following him.  Pt reports he is hearing whispers, not sure what they are saying.  Pt reports he plans on sitting in traffic to kill himself.  Reports he is 6 weeks clean from cocaine and alcohol.

## 2018-09-07 NOTE — Consult Note (Signed)
                       Tele-Psych Consultation Note  Patient is seen and chart is reviewed. He is pleasant and cooperative during the assessment. Today the patient denies any thoughts of self harm. Patient does not endorse symptoms of active psychosis and does not appear to be responding to internal stimuli during the assessment. The patient states "I just moved here. I have a boarding house to return to. [facility] will [facility]. I am feeling better now. I feel good about leaving here today with my follow up plan in place." [name] does not meet criteria for inpatient psychiatric treatment at this time. The patient is psychiatrically stable for discharge with plan to resume outpatient [facility]. Case staffed with TTS team including Dr. Nelly RoutArchana Kumar.   Fransisca KaufmannLaura Tarisa Paola PMHNP-C 09/07/2018 10:10 am

## 2018-09-07 NOTE — ED Notes (Signed)
Pt given scrubs to change into, staffing called for sitter.

## 2018-09-07 NOTE — ED Notes (Signed)
Denies SI on discharge

## 2018-09-07 NOTE — ED Notes (Signed)
Breakfast tray Ordered

## 2018-09-07 NOTE — ED Provider Notes (Signed)
42 year old here with suicidal ideations.  Please see previous providers for note full H&P.  Patient resting comfortably in exam bed.  No medical changes overnight, labs and vitals reviewed.  Awaiting [facility] health disposition.   Today's Vitals   09/06/18 0922 09/06/18 1746 09/06/18 2226 09/07/18 0607  BP: (!) 100/50 106/65  (!) 98/57  Pulse: 69 66  (!) 57  Resp: 15 16    Temp: 98.3 F (36.8 C) 97.6 F (36.4 C)  97.7 F (36.5 C)  TempSrc: Oral Oral  Oral  SpO2: 100% 100%  94%  Weight:      Height:      PainSc:   0-No pain    Body mass index is 23.77 kg/m.   Eyvonne MechanicHedges, Everette Mall, PA-C 09/07/18 0845    Rolan BuccoBelfi, Melanie, MD 09/07/18 [phone]

## 2018-09-07 NOTE — ED Notes (Signed)
Regular Diet was ordered for Lunch. 

## 2018-09-07 NOTE — Progress Notes (Addendum)
8:56am-CSW spoke with Carney BernJean from [facility] in Disposition to give update on CSW reaching out Lauren with [facility]. CSW has not received call back at this time. Carney BernJean expressed that she would look further into ACT Team Services for pt.   CSW aware through report that pt is being recommended for inpt psych placement at this time. CSW also made aware that pt is in need of ACT Team Services. CSW has reached out to Cory MunchLauren Joseph [phone] with [facility] to see if pt is already established with an ACT Team through [facility]  as notes report that pt follows up with [facility]  for medication needs. CSW awaits call from Lauren at this time.    Claude MangesKierra S. Yahia Bottger, MSW, LCSW-A Emergency Department Clinical Social Worker [phone]

## 2018-09-08 ENCOUNTER — Encounter (HOSPITAL_COMMUNITY): Payer: Self-pay | Admitting: Registered Nurse

## 2018-09-08 MED ORDER — QUETIAPINE FUMARATE 300 MG PO TABS: 300.0000 mg | ORAL_TABLET | Freq: Every day | ORAL | Status: DC

## 2018-09-08 MED ORDER — NALTREXONE HCL 50 MG PO TABS
50.0000 mg | ORAL_TABLET | Freq: Every day | ORAL | Status: DC
  Administered 2018-09-08: 50 mg via ORAL
  Filled 2018-09-08: qty 1

## 2018-09-08 MED ORDER — DIVALPROEX SODIUM 250 MG PO DR TAB
500.0000 mg | DELAYED_RELEASE_TABLET | Freq: Two times a day (BID) | ORAL | Status: DC
  Administered 2018-09-08: 500 mg via ORAL
  Filled 2018-09-08: qty 2

## 2018-09-08 MED ORDER — HALOPERIDOL 5 MG PO TABS: 10.0000 mg | ORAL_TABLET | Freq: Every day | ORAL | Status: DC

## 2018-09-08 MED ORDER — HYDROXYZINE PAMOATE 25 MG PO CAPS
25.0000 mg | ORAL_CAPSULE | Freq: Four times a day (QID) | ORAL | Status: DC | PRN
  Filled 2018-09-08: qty 1

## 2018-09-08 MED ORDER — BENZTROPINE MESYLATE 1 MG PO TABS
1.0000 mg | ORAL_TABLET | Freq: Two times a day (BID) | ORAL | Status: DC
  Administered 2018-09-08: 1 mg via ORAL
  Filled 2018-09-08: qty 1

## 2018-09-08 NOTE — Consult Note (Signed)
  Tele psych Assessment   Anthony Cunningham, 42 y.o., male patient seen via tele psych by TTS and this provider; chart reviewed and consulted with Dr. Lucianne MussKumar on 09/08/18.  Patient has presented to ED twice with the complaints of suicidal ideation.  Patient discharged 09/07/2018 after stating that he was feeling better and no longer endorsing suicidal ideation and a plan to [facility].  On evaluation Anthony Cunningham reports he stated he was feeling better yesterday but he wasn't; thought he could make it but he couldn't because he keeps having the crazy thoughts of sitting in traffic getting hit by a car or run over by a car.  Patient states that he does not feel safe and that his plan for suicide would be to sit in traffic hoping to get hit.  Patient states that he has prior suicide attempts.  Patient states that he has been drug free for 1 1/2 months but he feels hopeless.  Patient reports that he has followed up with [facility] about a week ago and they said they would be in touch in about 2-3 weeks but no appointment was set up at the time for medication management.  Patient is unable to contract for safety endorsing suicidal ideation.  Patient denies homicidal ideation and paranoia; but also endorses that he is hearing voices whispering and seeing red dots.     For detailed note see TTS tele assessment note  Recommendations:  Inpatient psychiatric treatment  Disposition:  Recommend psychiatric Inpatient admission when medically cleared.  Assunta FoundShuvon Rankin, NP

## 2018-09-08 NOTE — Progress Notes (Signed)
[facility] but asked if he could be IVC'd prior to transport as they have concerns about his willingness to sign in voluntarily at time of admission.  Neldon LabellaEmmy, RN agreed to consult with EDP to see if patient can be IVC'd.  Timmothy EulerJean T. Kaylyn LimSutter, MSW, LCSWA Disposition Clinical Social Work [phone] (cell) [phone] (office)

## 2018-09-08 NOTE — Progress Notes (Signed)
[facility]   Avanell ShackletonAngela Gannon, MD is the accepting/attending provider.  Call report to [phone]  [facility] notified.   Pt is IVC   Pt may be transported by [employer] Pt scheduled  to [facility] as soon as transport can be arranged  Carney BernJean T. Kaylyn LimSutter, MSW, LCSWA Disposition Clinical Social Work [phone] (cell) [phone] (office)

## 2018-09-08 NOTE — ED Notes (Signed)
Patient has extra large suitcase and 2 small belongings bags; Suitcase is un inventoried but patient states no valuables in suitcase only clothes and misc items-Monique,RN

## 2018-09-08 NOTE — ED Provider Notes (Signed)
Please see prior provider note for full H&P. Patient medically cleared by previous provider.  Briefly patient presented to ED with SI w/ plan. Hx of schizophrenia, depression, and substance abuse. Reportedly clean for several weeks.   Today he is resting comfortably without new complaints.   Physical Exam  BP 93/60 (BP Location: Left Arm)   Pulse (!) 59   Temp 98.7 F (37.1 C) (Oral)   Resp 16   Ht 5' 9.5" (1.765 m)   SpO2 98%   BMI 23.43 kg/m   Physical Exam Vitals signs and nursing note reviewed.  Constitutional:      General: He is not in acute distress.    Appearance: He is well-developed.  HENT:     Head: Normocephalic and atraumatic.  Eyes:     General:        Right eye: No discharge.        Left eye: No discharge.     Conjunctiva/sclera: Conjunctivae normal.  Neurological:     Mental Status: He is alert.     Comments: Clear speech.   Psychiatric:        Speech: Speech is not rapid and pressured.        Behavior: Behavior normal. Behavior is not agitated or aggressive.    Patient resting comfortably. Home medications have been re-ordered after I personally discussed home medication list with the patient. [facility], pending placement.       Cherly Andersonetrucelli, Jeffery Gammell R, PA-C 09/08/18 1025    Sabas SousBero, Michael M, MD 09/08/18 1714

## 2018-09-08 NOTE — Progress Notes (Signed)
Pt. meets criteria for inpatient treatment per Assunta FoundShuvon Rankin, NP.  Referred out to the following hospitals:  [facility]  [facility]  [facility]  [facility]  [facility]  [facility]  [facility]  [facility]  [facility]  [facility]     Disposition CSW will continue to follow for placement.  Timmothy EulerJean T. Kaylyn LimSutter, MSW, LCSWA Disposition Clinical Social Work [phone] (cell) [phone] (office)  [facility].

## 2018-09-08 NOTE — BH Assessment (Signed)
TTS clinician attempted to complete assessment on pt, though the Tele-Assessment machine was not answered to complete [facility] Assessment at either 0558 or at 0615. Will attempt at a later time.

## 2018-09-08 NOTE — BH Assessment (Signed)
Tele Assessment Note   Patient Name: Anthony Cunningham Born MRN: [id number] Referring Physician: Antony Madura Location of [facility]:  [facility] Location of Provider: Behavioral Health TTS Department  Anthony Cunningham is an 42 y.o. male who presented to [facility].  He was just discharged from [facility] yesterday after having been there since 09/05/18. 09/05/18 TTS Note: Anthony Cunningham is an 42 y.o., single male. Pt presented to [facility] voluntarily and unaccompanied. Pt reports having SI with a plan to sit in traffic. Pt seemed very nonchalant and was the only historian for this assessment. Pt reports having tried to commit suicide 7 times in the past. Pt endorses the following symptoms: feelings of hopelessness, despondence, loss of interest, isolation and trouble sleeping. Pt reports poor appetite. Pt denied AH, but reports that he sees spots. Pt reports no use of substances in 2 /months, but pt is positive for Cocaine. Pt reports being prescribed Haldol, Seroquel, Trazodone and Depakote by a previous doctor. Pt reports going to [facility] 09/04/[facility]. Pt reports having completed an assessment, as well as saw a doctor for MM. Pt denies HI.   Pt states that he lives in a rooming house that he moved in 2 days ago. Pt reports receiving MCD and SSI. Pt reports having no supports in this county. Pt reports being from [facility]. Pt reports no pending charges or probation. Pt reports no major medical issues.   Pt oriented to person, place, time and situation. Pt presented mostly alert, dressed appropriately in scrubs and groomed. Pt spoke with slurred speech. Pt did seem to be under the influence of any substances. Pt made decemt eye contact and answered questions appropriately. Pt presented with flat affect, and was calm and open to the assessment process. Pt presented with no impairments of remote or recent memory that could be detected seeing that he was the only historian.   09/08/18 TTS Assessment: Patient presents to [facility] for the second  time this week.  He states that he continues to have suicidal thoughts with a plan to sit in traffic and be hit by a car.  He states that he has made numerous suicide attempts in the past.  He was last hospitalized for suicidal thoughts 08/27/18 at [facility].  He states that he followed up for OP Services at [place].  Patient is homeless and has a history of SA use having used cocaine and alcohol, but states that he has been clean and sober for the past forty-five days.  Patient states that he had been drinking a six pack of beer three days weekly and states that he was snorting 2-3 lines of cocaine on weekends.  Patient denies HI, but states that he is hearing whispering voices and states that he is seeing red spots.  Patient states that he does not sleep more than 4 hours a night and states that he has not been eating well and he has lost weight, but unsure how much he has lost.  Patient denies a history of abuse, but states that he has cut in the past, but states that he has not cut since 1998.  Patient denies any current legal issues and states that he has no access to weapons.  Patient states: "I left the ED yesterday because I thought I was okay because I was feeling better, but after I left I realized that I was not any better."  Patient presented as alert and oriented, his thoughts were organized and his memory intact.  His judgment, insight and impulse control  were impaired.  His eye contact was good and his speech was clear and coherent.  He did not appear to be responding to any internal stimuli.  Patient was somewhat disheveled.  His psycho-motor activity was normal, his mood depressed.    Diagnosis: F33.3 MDD Recurrent Severe with Psychotic Features  Past Medical History: History reviewed. No pertinent past medical history.  History reviewed. No pertinent surgical history.  Family History: No family history on file.  Social History:  reports that he has been smoking. He has been  smoking about 1.50 packs per day. He has never used smokeless tobacco. He reports current alcohol use. He reports current drug use. Drug: Cocaine.  Additional Social History:  Alcohol / Drug Use Pain Medications: see MAR Prescriptions: see MAR Over the Counter: see MAR History of alcohol / drug use?: Yes Longest period of sobriety (when/how long): patient states that he has been clean dor 45 days Substance #1 Name of Substance 1: alcohol 1 - Age of First Use: 19 1 - Amount (size/oz): 6 pack 1 - Frequency: three times weekly 1 - Duration: since onset 1 - Last Use / Amount: 45 days ago Substance #2 Name of Substance 2: cocaine 2 - Age of First Use: 26 2 - Amount (size/oz): 2-3 lines 2 - Frequency: weekends 2 - Duration: since onset 2 - Last Use / Amount: 45 days ago  CIWA: CIWA-Ar BP: 93/60 Pulse Rate: (!) 59 COWS:    Allergies:  Allergies  Allergen Reactions  . Ibuprofen Hives    Home Medications: (Not in a hospital admission)   OB/GYN Status:  No LMP for male patient.  General Assessment Data Assessment unable to be completed: Yes Reason for not completing assessment: Unit unable to find Tele-Assessment Cart in which to complete assessment Location of Assessment: [facility] ED TTS Assessment: In system Is this a Tele or Face-to-Face Assessment?: Tele Assessment Is this an Initial Assessment or a Re-assessment for this encounter?: Initial Assessment Patient Accompanied by:: N/A Language Other than English: No Living Arrangements: Homeless/Shelter What gender do you identify as?: Male Marital status: Single Living Arrangements: Alone Can pt return to current living arrangement?: Yes Admission Status: Voluntary Is patient capable of signing voluntary admission?: Yes Referral Source: Self/Family/Friend Insurance type: Medicaid     Crisis Care Plan Living Arrangements: Alone Legal Guardian: Other:(self)  Education Status Is patient currently in school?: No Is the  patient employed, unemployed or receiving disability?: Receiving disability income  Risk to self with the past 6 months Suicidal Ideation: Yes-Currently Present Has patient been a risk to self within the past 6 months prior to admission? : Yes Suicidal Intent: Yes-Currently Present Has patient had any suicidal intent within the past 6 months prior to admission? : Yes Is patient at risk for suicide?: Yes Suicidal Plan?: Yes-Currently Present Has patient had any suicidal plan within the past 6 months prior to admission? : Yes Specify Current Suicidal Plan: sit in traffic Access to Means: Yes Specify Access to Suicidal Means: traffic What has been your use of drugs/alcohol within the last 12 months?: (no use in 45 days) Previous Attempts/Gestures: Yes How many times?: (5-6 times) Other Self Harm Risks: homeless Triggers for Past Attempts: None known Intentional Self Injurious Behavior: Cutting Comment - Self Injurious Behavior: 1998 Family Suicide History: No Recent stressful life event(s): Other (Comment)(homeless) Persecutory voices/beliefs?: No Depression: Yes Depression Symptoms: Insomnia, Isolating, Loss of interest in usual pleasures Substance abuse history and/or treatment for substance abuse?: Yes Suicide prevention information  given to non-admitted patients: Yes  Risk to Others within the past 6 months Homicidal Ideation: No Does patient have any lifetime risk of violence toward others beyond the six months prior to admission? : No Thoughts of Harm to Others: No Comment - Thoughts of Harm to Others: none Current Homicidal Intent: No Current Homicidal Plan: No Access to Homicidal Means: No Identified Victim: none History of harm to others?: No Assessment of Violence: None Noted Violent Behavior Description: none Does patient have access to weapons?: No Criminal Charges Pending?: No Does patient have a court date: No Is patient on probation?:  No  Psychosis Hallucinations: Auditory, Visual Delusions: None noted  Mental Status Report Appearance/Hygiene: Disheveled Eye Contact: Good Motor Activity: Freedom of movement Speech: Logical/coherent Level of Consciousness: Alert Mood: Depressed Affect: Depressed, Flat Anxiety Level: None Thought Processes: Coherent, Relevant Judgement: Impaired Orientation: Person, Place, Time, Situation Obsessive Compulsive Thoughts/Behaviors: None  Cognitive Functioning Concentration: Decreased Memory: Recent Intact, Remote Intact Is patient IDD: No Insight: Poor Impulse Control: Poor Appetite: Poor Have you had any weight changes? : Loss Amount of the weight change? (lbs): (unknown) Sleep: Decreased Total Hours of Sleep: 4 Vegetative Symptoms: Decreased grooming  ADLScreening [facility] Assessment Services) Patient's cognitive ability adequate to safely complete daily activities?: Yes Patient able to express need for assistance with ADLs?: Yes Independently performs ADLs?: Yes (appropriate for developmental age)  Prior Inpatient Therapy Prior Inpatient Therapy: Yes Prior Therapy Dates: December 5th [facility] Reason for Treatment: depression/suicidal  Prior Outpatient Therapy Prior Outpatient Therapy: Yes Prior Therapy Dates: active Prior Therapy Facilty/Provider(s): [facility] Reason for Treatment: depression Does patient have an ACCT team?: No(ACTT Services have been recommended) Does patient have Intensive In-House Services?  : No Does patient have [facility] services? : Yes Does patient have P4CC services?: No  ADL Screening (condition at time of admission) Patient's cognitive ability adequate to safely complete daily activities?: Yes Is the patient deaf or have difficulty hearing?: No Does the patient have difficulty seeing, even when wearing glasses/contacts?: No Does the patient have difficulty concentrating, remembering, or making  decisions?: No Patient able to express need for assistance with ADLs?: Yes Does the patient have difficulty dressing or bathing?: No Independently performs ADLs?: Yes (appropriate for developmental age) Does the patient have difficulty walking or climbing stairs?: No Weakness of Legs: None Weakness of Arms/Hands: None  Home Assistive Devices/Equipment Home Assistive Devices/Equipment: None  Therapy Consults (therapy consults require a physician order) PT Evaluation Needed: No OT Evalulation Needed: No SLP Evaluation Needed: No Abuse/Neglect Assessment (Assessment to be complete while patient is alone) Abuse/Neglect Assessment Can Be Completed: Yes Physical Abuse: Denies Verbal Abuse: Denies Sexual Abuse: Denies Exploitation of patient/patient's resources: Denies Self-Neglect: Denies Values / Beliefs Cultural Requests During Hospitalization: None Spiritual Requests During Hospitalization: None Consults Spiritual Care Consult Needed: No Social Work Consult Needed: No   Nutrition Screen- [facility]/AP Has the patient recently lost weight without trying?: Yes, 2-13 lbs. Has the patient been eating poorly because of a decreased appetite?: Yes Malnutrition Screening Tool Score: 2        Disposition:  Per Shuvon Rankin, NP, patient meets inpatient admission criteria Disposition Initial Assessment Completed for this Encounter: Yes Disposition of Patient: (pending provider review)  This service was provided via telemedicine using a 2-way, interactive audio and video technology.  Names of all persons participating in this telemedicine service and their role in this encounter. Name: Hebert Sohooby Ressel Role: patient  Name: Clayborn Milnes Role: TTS  Name:  Role:   Name:  Role:     Daphene Calamity 09/08/2018 8:22 AM

## 2018-09-08 NOTE — ED Provider Notes (Signed)
[facility]   CSN: [id number] Arrival date & time: 09/07/18  2055     History   Chief Complaint Chief Complaint  Patient presents with  . Suicidal    HPI Hebert Sohooby Yasin is a 42 y.o. male.   42 year old male presents to the emergency department for suicidal ideations.  He denies any plan, but reports in triage that he does not "have any hope" or anything "to live for".  He referenced thoughts about sitting in traffic to kill himself while in triage.  Endorses 6 weeks of sobriety from cocaine and alcohol.  Has plans to follow-up with [facility] in 2 weeks, but does not feel he can make it to this appointment.  Is experiencing visual hallucinations, stating that he is seeing "diamonds" in his vision.     History reviewed. No pertinent past medical history.  Patient Active Problem List   Diagnosis Date Noted  . Cocaine dependence, continuous abuse (HCC) 09/05/2018  . Substance induced mood disorder (HCC) 09/05/2018  . History of major depression 09/05/2018  . History of schizophrenia 09/05/2018    History reviewed. No pertinent surgical history.      Home Medications    Prior to Admission medications   Medication Sig Start Date End Date Taking? Authorizing Provider  ARIPiprazole (ABILIFY IM) Inject 1 Dose into the muscle every 30 (thirty) days.    [provider]  benztropine (COGENTIN) 1 MG tablet Take 1 mg by mouth 2 (two) times daily.    [provider]  divalproex (DEPAKOTE) 500 MG DR tablet Take 500 mg by mouth 2 (two) times daily.    [provider]  haloperidol (HALDOL) 10 MG tablet Take 10 mg by mouth at bedtime.    [provider]  hydrOXYzine (VISTARIL) 25 MG capsule Take 25 mg by mouth every 6 (six) hours as needed for anxiety.    [provider]  naltrexone (DEPADE) 50 MG tablet Take 50 mg by mouth daily.    [provider]  QUEtiapine (SEROQUEL) 300 MG  tablet Take 300 mg by mouth at bedtime.    [provider]    Family History No family history on file.  Social History Social History   Tobacco Use  . Smoking status: Current Every Day Smoker    Packs/day: 1.50  . Smokeless tobacco: Never Used  Substance Use Topics  . Alcohol use: Yes  . Drug use: Yes    Types: Cocaine     Allergies   Ibuprofen   Review of Systems Review of Systems Ten systems reviewed and are negative for acute change, except as noted in the HPI.    Physical Exam Updated Vital Signs BP 93/60 (BP Location: Left Arm)   Pulse (!) 59   Temp 98.7 F (37.1 C) (Oral)   Resp 16   Ht 5' 9.5" (1.765 m)   SpO2 98%   BMI 23.43 kg/m   Physical Exam Vitals signs and nursing note reviewed.  Constitutional:      General: He is not in acute distress.    Appearance: He is well-developed. He is not diaphoretic.     Comments: Calm and cooperative  HENT:     Head: Normocephalic and atraumatic.  Eyes:     General: No scleral icterus.    Conjunctiva/sclera: Conjunctivae normal.  Neck:     Musculoskeletal: Normal range of motion.  Pulmonary:     Effort: Pulmonary effort is normal. No respiratory distress.  Comments: Respirations even and unlabored Musculoskeletal: Normal range of motion.  Skin:    General: Skin is warm and dry.     Coloration: Skin is not pale.     Findings: No erythema or rash.  Neurological:     Mental Status: He is alert and oriented to person, place, and time.  Psychiatric:        Behavior: Behavior normal.     Comments: Endorses SI without plan. +visual hallucinations, states he is "seeing diamonds" in front of him.      ED Treatments / Results  Labs (all labs ordered are listed, but only abnormal results are displayed) Labs Reviewed  COMPREHENSIVE METABOLIC PANEL - Abnormal; Notable for the following components:      Result Value   Glucose, Bld 111 (*)    All other components within normal limits    ACETAMINOPHEN LEVEL - Abnormal; Notable for the following components:   Acetaminophen (Tylenol), Serum <10 (*)    All other components within normal limits  CBC - Abnormal; Notable for the following components:   WBC 11.6 (*)    Hemoglobin 12.8 (*)    Platelets 416 (*)    All other components within normal limits  ETHANOL  SALICYLATE LEVEL  RAPID URINE DRUG SCREEN, HOSP PERFORMED    EKG None  Radiology No results found.  Procedures Procedures (including critical care time)  Medications Ordered in ED Medications - No data to display   Initial Impression / Assessment and Plan / ED Course  I have reviewed the triage vital signs and the nursing notes.  Pertinent labs & imaging results that were available during my care of the patient were reviewed by me and considered in my medical decision making (see chart for details).     42 year old male presents to the emergency department for evaluation of suicidal ideations.  Was discharged 12 hours prior to ED presentation tonight after 48 hours observation for anxiety.  Denies any alcohol or drug use.  No suicidal plan.  Pending TTS evaluation for recommendations.  The patient has been medically cleared.   Final Clinical Impressions(s) / ED Diagnoses   Final diagnoses:  Suicidal thoughts  Visual hallucinations    ED Discharge Orders    None       Antony Madura, PA-C 09/08/18 1610    Marily Memos, MD 09/08/18 [phone]

## 2018-09-08 NOTE — ED Provider Notes (Signed)
3:27 PM Patient in no distress. [facility]  Dr. Mikey CollegeGannon is accepting.   Gerhard MunchLockwood, Terek Bee, MD 09/08/18 [phone]

## 2018-09-08 NOTE — ED Provider Notes (Signed)
Patient seen at request of nursing staff.  [facility]  [facility].  Patient understands the reason for IVC.  Patient is agreeable to IVC.  IVC initiated in order to [facility].   Wynetta FinesMessick, Peter C, MD 09/08/18 1037

## 2018-09-25 ENCOUNTER — Encounter (HOSPITAL_COMMUNITY): Payer: Self-pay

## 2018-09-25 ENCOUNTER — Other Ambulatory Visit: Payer: Self-pay

## 2018-09-25 ENCOUNTER — Emergency Department (HOSPITAL_COMMUNITY)
Admission: EM | Admit: 2018-09-25 | Discharge: 2018-09-26 | Disposition: A | Discharge status: Home / Self Care | Payer: Medicaid Other | Attending: Emergency Medicine | Admitting: Emergency Medicine

## 2018-09-25 DIAGNOSIS — F1210 Cannabis abuse, uncomplicated: Secondary | ICD-10-CM | POA: Insufficient documentation

## 2018-09-25 DIAGNOSIS — Z79899 Other long term (current) drug therapy: Secondary | ICD-10-CM | POA: Insufficient documentation

## 2018-09-25 DIAGNOSIS — F17210 Nicotine dependence, cigarettes, uncomplicated: Secondary | ICD-10-CM | POA: Diagnosis not present

## 2018-09-25 DIAGNOSIS — R440 Auditory hallucinations: Secondary | ICD-10-CM | POA: Diagnosis not present

## 2018-09-25 DIAGNOSIS — R443 Hallucinations, unspecified: Secondary | ICD-10-CM | POA: Diagnosis not present

## 2018-09-25 DIAGNOSIS — R45851 Suicidal ideations: Secondary | ICD-10-CM

## 2018-09-25 DIAGNOSIS — F209 Schizophrenia, unspecified: Secondary | ICD-10-CM | POA: Insufficient documentation

## 2018-09-25 DIAGNOSIS — F1994 Other psychoactive substance use, unspecified with psychoactive substance-induced mood disorder: Secondary | ICD-10-CM | POA: Insufficient documentation

## 2018-09-25 LAB — CBC
HCT: 40.4 % (ref 39.0–52.0)
HEMOGLOBIN: 13 g/dL (ref 13.0–17.0)
MCH: 29.2 pg (ref 26.0–34.0)
MCHC: 32.2 g/dL (ref 30.0–36.0)
MCV: 90.8 fL (ref 80.0–100.0)
Platelets: 351 10*3/uL (ref 150–400)
RBC: 4.45 MIL/uL (ref 4.22–5.81)
RDW: 13.4 % (ref 11.5–15.5)
WBC: 11.3 10*3/uL — ABNORMAL HIGH (ref 4.0–10.5)
nRBC: 0 % (ref 0.0–0.2)

## 2018-09-25 LAB — COMPREHENSIVE METABOLIC PANEL
ALBUMIN: 4.7 g/dL (ref 3.5–5.0)
ALT: 21 U/L (ref 0–44)
AST: 18 U/L (ref 15–41)
Alkaline Phosphatase: 63 U/L (ref 38–126)
Anion gap: 12 (ref 5–15)
BUN: 15 mg/dL (ref 6–20)
CO2: 27 mmol/L (ref 22–32)
CREATININE: 0.89 mg/dL (ref 0.61–1.24)
Calcium: 9.5 mg/dL (ref 8.9–10.3)
Chloride: 102 mmol/L (ref 98–111)
GFR calc Af Amer: >60 mL/min (ref >60)
GFR calc non Af Amer: >60 mL/min (ref >60)
Glucose, Bld: 96 mg/dL (ref 70–99)
Potassium: 3.8 mmol/L (ref 3.5–5.1)
Sodium: 141 mmol/L (ref 135–145)
Total Bilirubin: 0.4 mg/dL (ref 0.3–1.2)
Total Protein: 7.9 g/dL (ref 6.5–8.1)

## 2018-09-25 LAB — RAPID URINE DRUG SCREEN, HOSP PERFORMED
Amphetamines: NOT DETECTED
Barbiturates: NOT DETECTED
Benzodiazepines: NOT DETECTED
Cocaine: NOT DETECTED
Opiates: NOT DETECTED
TETRAHYDROCANNABINOL: POSITIVE — AB

## 2018-09-25 LAB — ETHANOL: Alcohol, Ethyl (B): <10 mg/dL (ref <10)

## 2018-09-25 LAB — ACETAMINOPHEN LEVEL: Acetaminophen (Tylenol), Serum: <10 ug/mL — ABNORMAL LOW (ref 10–30)

## 2018-09-25 LAB — SALICYLATE LEVEL: Salicylate Lvl: <7 mg/dL (ref 2.8–30.0)

## 2018-09-25 MED ORDER — ACETAMINOPHEN 325 MG PO TABS: 650.0000 mg | ORAL_TABLET | ORAL | Status: DC | PRN

## 2018-09-25 NOTE — Congregational Nurse Program (Signed)
Recently relocated from [place] - homeless.  Looking for shelter and housing, Referred to [facility]

## 2018-09-25 NOTE — ED Notes (Signed)
Bed: WLPT4 Expected date:  Expected time:  Means of arrival:  Comments: 

## 2018-09-25 NOTE — ED Notes (Signed)
Pt oriented to room and unit.  Pt is calm and cooperative.  Pt states he is suicidal and feels worthless.  Pt contracts for safety.  15 minute checks and video monitoring in place.

## 2018-09-25 NOTE — ED Notes (Signed)
Bed: WLPT3 Expected date:  Expected time:  Means of arrival:  Comments: 
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
## 2018-09-25 NOTE — ED Triage Notes (Addendum)
Per EMS- [place] stating that he was suicidal. Patient did not verbalize a plan. Patient states he has not had his medications x 5 days. Patient also reports that he feels very agitated. Patient admitted to smoking marijuana, but denies any alcohol.  Patient stated while in triage that hs plan is to cut himself with anything sharp.

## 2018-09-25 NOTE — ED Provider Notes (Signed)
[facility]-EMERGENCY DEPT Provider Note   CSN: [id number] Arrival date & time: 09/25/18  1624     History   Chief Complaint Chief Complaint  Patient presents with  . Suicidal    HPI Anthony Cunningham is a 43 y.o. male.  Anthony Cunningham is a 43 y.o. male with a history of schizophrenia, depression and substance abuse mood disorder, who presents to the emergency department via EMS for evaluation of suicidal ideations and hallucinations.  He reports that he has been out of all of his medications for the past 5 days and has been unable to get them and reports since then he has had more persistent hallucinations, and he reports these voices are telling him to harm himself.  He denies any HI.  Reports he also sees things intermittently but is unable to describe what he sees.  He reports because the voices are becoming more more persistent he has had thoughts of harming himself, he denies specific plan but has a history of attempting to cut himself, denies any attempt to harm himself prior to arrival, no ingestions.  He reports smoking marijuana but denies any alcohol or other substance use.  He reports he has had some mild low back pain intermittently but no acute medical complaints, denies chest pain, shortness of breath, abdominal pain, nausea, vomiting, urinary symptoms, headaches, vision changes.     History reviewed. No pertinent past medical history.  Patient Active Problem List   Diagnosis Date Noted  . Cocaine dependence, continuous abuse (HCC) 09/05/2018  . Substance induced mood disorder (HCC) 09/05/2018  . History of major depression 09/05/2018  . History of schizophrenia 09/05/2018    History reviewed. No pertinent surgical history.      Home Medications    Prior to Admission medications   Medication Sig Start Date End Date Taking? Authorizing Provider  ARIPiprazole (ABILIFY IM) Inject 1 Dose into the muscle every 30 (thirty) days.    [provider]  benztropine (COGENTIN) 1 MG tablet Take 1 mg by mouth 2 (two) times daily.    [provider]  divalproex (DEPAKOTE) 500 MG DR tablet Take 500 mg by mouth 2 (two) times daily.    [provider]  haloperidol (HALDOL) 10 MG tablet Take 10 mg by mouth at bedtime.    [provider]  hydrOXYzine (VISTARIL) 25 MG capsule Take 25 mg by mouth every 6 (six) hours as needed for anxiety.    [provider]  naltrexone (DEPADE) 50 MG tablet Take 50 mg by mouth daily.    [provider]  QUEtiapine (SEROQUEL) 300 MG tablet Take 300 mg by mouth at bedtime.    [provider]    Family History History reviewed. No pertinent family history.  Social History Social History   Tobacco Use  . Smoking status: Current Every Day Smoker    Packs/day: 1.50    Types: Cigarettes  . Smokeless tobacco: Never Used  Substance Use Topics  . Alcohol use: Not Currently  . Drug use: Not Currently    Types: Cocaine    Comment: smoked marijuana today.     Allergies   Ibuprofen   Review of Systems Review of Systems  Constitutional: Negative for chills and fever.  HENT: Negative.   Eyes: Negative for visual disturbance.  Respiratory: Negative for cough and shortness of breath.   Cardiovascular: Negative for chest pain.  Gastrointestinal: Negative for abdominal pain, nausea and vomiting.  Genitourinary: Negative for dysuria.  Musculoskeletal:  Negative for arthralgias and myalgias.  Skin: Negative for color change and rash.  Neurological: Negative for dizziness, syncope and light-headedness.  Psychiatric/Behavioral: Positive for dysphoric mood, hallucinations and suicidal ideas.  All other systems reviewed and are negative.    Physical Exam Updated Vital Signs BP 132/82 (BP Location: Left Arm)   Pulse 80   Temp 98.3 F (36.8 C) (Oral)   Resp 16   Ht 5' 9.5" (1.765 m)   Wt 73 kg   SpO2 99%   BMI 23.43 kg/m   Physical Exam Vitals signs  and nursing note reviewed.  Constitutional:      General: He is not in acute distress.    Appearance: He is well-developed. He is not diaphoretic.  HENT:     Head: Normocephalic and atraumatic.     Mouth/Throat:     Mouth: Mucous membranes are moist.     Pharynx: Oropharynx is clear.  Eyes:     General:        Right eye: No discharge.        Left eye: No discharge.     Pupils: Pupils are equal, round, and reactive to light.  Neck:     Musculoskeletal: Neck supple.  Cardiovascular:     Rate and Rhythm: Normal rate and regular rhythm.     Pulses: Normal pulses.     Heart sounds: Normal heart sounds. No murmur. No friction rub. No gallop.   Pulmonary:     Effort: Pulmonary effort is normal. No respiratory distress.     Breath sounds: Normal breath sounds. No wheezing or rales.     Comments: Respirations equal and unlabored, patient able to speak in full sentences, lungs clear to auscultation bilaterally Abdominal:     General: Abdomen is flat. Bowel sounds are normal. There is no distension.     Palpations: Abdomen is soft. There is no mass.     Tenderness: There is no abdominal tenderness. There is no guarding.     Comments: Abdomen soft, nondistended, nontender to palpation in all quadrants without guarding or peritoneal signs  Musculoskeletal:        General: No deformity.     Comments: Mild tenderness over the low back musculature with no midline spinal tenderness.  Skin:    General: Skin is warm and dry.     Capillary Refill: Capillary refill takes less than 2 seconds.  Neurological:     Mental Status: He is alert and oriented to person, place, and time. Mental status is at baseline.     Coordination: Coordination normal.     Comments: Moving all extremities without difficulty, clear speech, able to follow commands   Psychiatric:        Attention and Perception: He perceives auditory and visual hallucinations.        Mood and Affect: Affect is labile.        Speech:  Speech normal.        Behavior: Behavior normal. Behavior is cooperative.        Thought Content: Thought content includes suicidal ideation. Thought content does not include homicidal ideation. Thought content includes suicidal plan. Thought content does not include homicidal plan.      ED Treatments / Results  Labs (all labs ordered are listed, but only abnormal results are displayed) Labs Reviewed  ACETAMINOPHEN LEVEL - Abnormal; Notable for the following components:      Result Value   Acetaminophen (Tylenol), Serum <10 (*)    All other components within  normal limits  CBC - Abnormal; Notable for the following components:   WBC 11.3 (*)    All other components within normal limits  RAPID URINE DRUG SCREEN, HOSP PERFORMED - Abnormal; Notable for the following components:   Tetrahydrocannabinol POSITIVE (*)    All other components within normal limits  COMPREHENSIVE METABOLIC PANEL  ETHANOL  SALICYLATE LEVEL    EKG ED ECG REPORT   Date: 09/25/2018  Rate: 100  Rhythm: normal sinus rhythm and premature atrial contractions (PAC)  QRS Axis: normal  Intervals: normal  ST/T Wave abnormalities: normal  Conduction Disutrbances:none  Narrative Interpretation: Sinus rhythm with no ischemic changes  Old EKG Reviewed: unchanged  I have personally reviewed the EKG tracing and agree with the computerized printout as noted.  Radiology No results found.  Procedures Procedures (including critical care time)  Medications Ordered in ED Medications  acetaminophen (TYLENOL) tablet 650 mg (has no administration in time range)     Initial Impression / Assessment and Plan / ED Course  I have reviewed the triage vital signs and the nursing notes.  Pertinent labs & imaging results that were available during my care of the patient were reviewed by me and considered in my medical decision making (see chart for details).  43 year old male brought in by EMS for evaluation of suicidal  ideations, patient does not verbalize specific plan but has tried to cut himself in the past.  He reports he has been out of all of his medications for the past 5 days and since then has been hearing persistent voices telling him to hurt himself.  He reports he feels agitated and unsettled.  He reports smoking marijuana but denies any alcohol or other substance use.  Denies any focal medical complaints reports he has had some mild intermittent pains in his low back but no numbness tingling or weakness, no loss of bowel or bladder control and no concern for cauda equina.  Tenderness is present over the low back musculature with no midline spinal tenderness.  No other findings on exam, vitals normal.  Will get medical screening labs and EKG.  Labs overall reassuring, minimal leukocytosis of 11.3 and patient not having any focal infectious symptoms, normal hemoglobin, no acute electrolyte derangements requiring intervention, normal renal and liver function, acetaminophen, salicylate and ethanol levels negative, UDS positive for cannabis as patient reported but no other drugs detected.  EKG without any concerning changes, sinus rhythm.  At this time patient is medically cleared for psychiatric evaluation, TTS consult placed, patient placed under ED psych hold.  TTS is seen and evaluated the patient and recommend overnight observation and reevaluation in the morning.  Final Clinical Impressions(s) / ED Diagnoses   Final diagnoses:  Suicidal ideation  Hallucinations    ED Discharge Orders    None       Legrand Rams 09/25/18 2215    Lorre Nick, MD 09/26/18 2100

## 2018-09-25 NOTE — ED Notes (Signed)
Pt encouraged to provide urine sample.

## 2018-09-25 NOTE — Progress Notes (Signed)
Received Anthony Cunningham at shift change in his room eating. He endorsed feeling anxious, hearing voices telling him to kill hisself and feeling suicidal. He stated he feels safe here in the hospital. He eventually drifted off to sleep and slept throughout the night eith on visit to the bathroom.

## 2018-09-26 DIAGNOSIS — F1994 Other psychoactive substance use, unspecified with psychoactive substance-induced mood disorder: Secondary | ICD-10-CM

## 2018-09-26 DIAGNOSIS — R443 Hallucinations, unspecified: Secondary | ICD-10-CM

## 2018-09-26 DIAGNOSIS — R45851 Suicidal ideations: Secondary | ICD-10-CM

## 2018-09-26 MED ORDER — DIVALPROEX SODIUM 500 MG PO DR TAB: 500.0000 mg | DELAYED_RELEASE_TABLET | Freq: Two times a day (BID) | ORAL | 0 refills | Status: AC

## 2018-09-26 MED ORDER — HALOPERIDOL 5 MG PO TABS: 10.0000 mg | ORAL_TABLET | Freq: Every day | ORAL | Status: DC

## 2018-09-26 MED ORDER — BENZTROPINE MESYLATE 1 MG PO TABS: 1.0000 mg | ORAL_TABLET | Freq: Every day | ORAL | 0 refills | Status: AC

## 2018-09-26 MED ORDER — DIVALPROEX SODIUM 500 MG PO DR TAB
500.0000 mg | DELAYED_RELEASE_TABLET | Freq: Two times a day (BID) | ORAL | Status: DC
  Administered 2018-09-26: 500 mg via ORAL
  Filled 2018-09-26: qty 1

## 2018-09-26 MED ORDER — LORAZEPAM 2 MG/ML IJ SOLN: 1.0000 mg | Freq: Once | INTRAMUSCULAR | Status: DC

## 2018-09-26 MED ORDER — HALOPERIDOL 10 MG PO TABS: 10.0000 mg | ORAL_TABLET | Freq: Every day | ORAL | 0 refills | Status: AC

## 2018-09-26 MED ORDER — DIPHENHYDRAMINE HCL 50 MG/ML IJ SOLN: 25.0000 mg | Freq: Once | INTRAMUSCULAR | Status: DC

## 2018-09-26 MED ORDER — HALOPERIDOL LACTATE 5 MG/ML IJ SOLN: 5.0000 mg | Freq: Once | INTRAMUSCULAR | Status: DC

## 2018-09-26 NOTE — Discharge Instructions (Signed)
For your mental health needs, please follow up at:  [facility] [address] [place], [place] [address] [phone] Walk-in appointments available from 8 AM to 5 PM Monday through Friday. You may call for an appointment. You must go there first to establish care. On your first visit please tell them you only have a 7 day supply of medication.

## 2018-09-26 NOTE — ED Notes (Signed)
Pt d/c home per MD order. Discharge summary reviewed with pt. Pt verbalizes understanding. Pt denies SI/HI/AVH. RX provided, area resources provided. Bus pass provided per pt request. Pt signed e-signature. Personal property returned. Pt ambulatory off unit with MHT.

## 2018-09-26 NOTE — Consult Note (Addendum)
[facility] ED Discharge  09/26/2018 12:03 PM Anthony Cunningham  MRN:  [id number] Principal Problem: Substance induced mood disorder [facility]) Discharge Diagnoses: Principal Problem:   Substance induced mood disorder (HCC)   Subjective: Pt was seen and chart reviewed with treatment team and Dr Lucianne Muss.  Pt denies suicidal/homicidal ideation, denies auditory/visual hallucinations and does not appear to be responding to internal stimuli. Pt has had 5 ED visits and 2 inpatient admissions in the month of December. Pt's UDS positive for THC, BAL negative.Pt was recently (09-07-18) sent to [facility] for inpatient admission. Pt was restarted on his home medications as he stated he has been out for five days. This writer called [facility] and spoke with Sharia Reeve, Pt was discharged from that hospital on Thursday 09-24-2018. Pt then showed up at [facility] on 09-25-2018.  Pt stated he lives in [place] with his grandmother and her number is [phone]. Attempted to call this number, no answer, no voice mail. Pt is asking for long term placement. Pt will be given outpatient resources and shelter resources. Pt given prescriptions at discharge for: Depakote 500 mg BID, Haldol 10 mg at bedtime and Cogentin 1 mg daily.  Pt is psychiatrically clear.   Total Time spent with patient: 30 minutes  Past Psychiatric History: As above   Past Medical History: History reviewed. No pertinent past medical history. History reviewed. No pertinent surgical history. Family History: History reviewed. No pertinent family history. Family Psychiatric  History: Pt did not give this information Social History:  Social History   Substance and Sexual Activity  Alcohol Use Not Currently     Social History   Substance and Sexual Activity  Drug Use Not Currently  . Types: Cocaine   Comment: smoked marijuana today.    Social History   Socioeconomic History  . Marital status: Single    Spouse name: Not on file  . Number of children:  Not on file  . Years of education: Not on file  . Highest education level: Not on file  Occupational History  . Not on file  Social Needs  . Financial resource strain: Not on file  . Food insecurity:    Worry: Not on file    Inability: Not on file  . Transportation needs:    Medical: Not on file    Non-medical: Not on file  Tobacco Use  . Smoking status: Current Every Day Smoker    Packs/day: 1.50    Types: Cigarettes  . Smokeless tobacco: Never Used  Substance and Sexual Activity  . Alcohol use: Not Currently  . Drug use: Not Currently    Types: Cocaine    Comment: smoked marijuana today.  Marland Kitchen Sexual activity: Not on file  Lifestyle  . Physical activity:    Days per week: Not on file    Minutes per session: Not on file  . Stress: Not on file  Relationships  . Social connections:    Talks on phone: Not on file    Gets together: Not on file    Attends religious service: Not on file    Active member of club or organization: Not on file    Attends meetings of clubs or organizations: Not on file    Relationship status: Not on file  Other Topics Concern  . Not on file  Social History Narrative  . Not on file    Has this patient used any form of tobacco in the last 30 days? (Cigarettes, Smokeless Tobacco, Cigars, and/or  Pipes) Prescription not provided because: Pt declined  Current Medications: Current Facility-Administered Medications  Medication Dose Route Frequency Provider Last Rate Last Dose  . acetaminophen (TYLENOL) tablet 650 mg  650 mg Oral Q4H PRN Jodi GeraldsFord, Kelsey N, PA-C      . diphenhydrAMINE (BENADRYL) injection 25 mg  25 mg Intramuscular Once Harris, Abigail, PA-C      . haloperidol lactate (HALDOL) injection 5 mg  5 mg Intramuscular Once Arthor CaptainHarris, Abigail, PA-C      . LORazepam (ATIVAN) injection 1 mg  1 mg Intramuscular Once Arthor CaptainHarris, Abigail, PA-C       Current Outpatient Medications  Medication Sig Dispense Refill  . ARIPiprazole (ABILIFY IM) Inject 1 Dose  into the muscle every 30 (thirty) days.    . benztropine (COGENTIN) 1 MG tablet Take 1 mg by mouth 2 (two) times daily.    . divalproex (DEPAKOTE) 500 MG DR tablet Take 500 mg by mouth 2 (two) times daily.    . haloperidol (HALDOL) 10 MG tablet Take 10 mg by mouth at bedtime.    . hydrOXYzine (VISTARIL) 25 MG capsule Take 25 mg by mouth every 6 (six) hours as needed for anxiety.    Marland Kitchen. LITHIUM PO Take 1 capsule by mouth.    . naltrexone (DEPADE) 50 MG tablet Take 50 mg by mouth daily.    . QUEtiapine (SEROQUEL) 300 MG tablet Take 300 mg by mouth at bedtime.    . traZODone (DESYREL) 100 MG tablet Take 100 mg by mouth at bedtime.       Musculoskeletal: Strength & Muscle Tone: within normal limits Gait & Station: normal Patient leans: N/A  Psychiatric Specialty Exam: Physical Exam  ROS  Blood pressure 102/67, pulse 76, temperature 98.7 F (37.1 C), temperature source Oral, resp. rate 14, height 5' 9.5" (1.765 m), weight 73 kg, SpO2 99 %.Body mass index is 23.43 kg/m.  General Appearance: Casual  Eye Contact:  Good  Speech:  Clear and Coherent and Normal Rate  Volume:  Normal  Mood:  Depressed  Affect:  Congruent and Depressed  Thought Process:  Coherent, Goal Directed, Linear and Descriptions of Associations: Intact  Orientation:  Full (Time, Place, and Person)  Thought Content:  Logical  Suicidal Thoughts:  No  Homicidal Thoughts:  No  Memory:  Immediate;   Good Recent;   Fair Remote;   Fair  Judgement:  Fair  Insight:  Fair  Psychomotor Activity:  Normal  Concentration:  Concentration: Good and Attention Span: Good  Recall:  Good  Fund of Knowledge:  Good  Language:  Good  Akathisia:  No  Handed:  Right  AIMS (if indicated):     Assets:  Communication Skills Social Support  ADL's:  Intact  Cognition:  WNL  Sleep:        Demographic Factors:  Male, Low socioeconomic status and Unemployed  Loss Factors: Financial problems/change in socioeconomic  status  Historical Factors: Family history of mental illness or substance abuse  Risk Reduction Factors:   Sense of responsibility to family  Continued Clinical Symptoms:  Bipolar Disorder:   Depressive phase  Cognitive Features That Contribute To Risk:  Closed-mindedness    Suicide Risk:  Minimal: No identifiable suicidal ideation.  Patients presenting with no risk factors but with morbid ruminations; may be classified as minimal risk based on the severity of the depressive symptoms   Plan Of Care/Follow-up recommendations:  Activity:  as tolerated Diet:  Heart healthy  Disposition: Substance induced mood disorder (HCC) Take  all medications as prescribed. You were provided prescriptions for medications on discharge. [facility] to [facility] and have them filled.  Keep all follow-up appointments as scheduled. [facility] and set up outpatient services upon discharge.  Do not consume alcohol or use illegal drugs while on prescription medications. Report any adverse effects from your medications to your primary care provider promptly.  In the event of recurrent symptoms or worsening symptoms, call 911, a crisis hotline, or go to the nearest emergency department for evaluation.   Laveda AbbeLaurie Britton Parks, NP 09/26/2018, 12:03 PM

## 2018-09-30 ENCOUNTER — Encounter: Payer: Self-pay | Admitting: Pediatric Intensive Care

## 2018-09-30 ENCOUNTER — Emergency Department (HOSPITAL_COMMUNITY)
Admission: EM | Admit: 2018-09-30 | Discharge: 2018-09-30 | Disposition: A | Discharge status: Home / Self Care | Payer: Medicaid Other | Attending: Emergency Medicine | Admitting: Emergency Medicine

## 2018-09-30 DIAGNOSIS — R45850 Homicidal ideations: Secondary | ICD-10-CM | POA: Diagnosis not present

## 2018-09-30 DIAGNOSIS — Z9114 Patient's other noncompliance with medication regimen: Secondary | ICD-10-CM | POA: Diagnosis not present

## 2018-09-30 DIAGNOSIS — F329 Major depressive disorder, single episode, unspecified: Secondary | ICD-10-CM | POA: Diagnosis not present

## 2018-09-30 DIAGNOSIS — Z046 Encounter for general psychiatric examination, requested by authority: Secondary | ICD-10-CM | POA: Insufficient documentation

## 2018-09-30 DIAGNOSIS — Z79899 Other long term (current) drug therapy: Secondary | ICD-10-CM | POA: Insufficient documentation

## 2018-09-30 DIAGNOSIS — F419 Anxiety disorder, unspecified: Secondary | ICD-10-CM | POA: Insufficient documentation

## 2018-09-30 DIAGNOSIS — R441 Visual hallucinations: Secondary | ICD-10-CM | POA: Diagnosis not present

## 2018-09-30 DIAGNOSIS — R45851 Suicidal ideations: Secondary | ICD-10-CM | POA: Diagnosis not present

## 2018-09-30 DIAGNOSIS — F17210 Nicotine dependence, cigarettes, uncomplicated: Secondary | ICD-10-CM | POA: Insufficient documentation

## 2018-09-30 DIAGNOSIS — F22 Delusional disorders: Secondary | ICD-10-CM | POA: Insufficient documentation

## 2018-09-30 LAB — RAPID URINE DRUG SCREEN, HOSP PERFORMED
Amphetamines: NOT DETECTED
Barbiturates: NOT DETECTED
Benzodiazepines: NOT DETECTED
Cocaine: NOT DETECTED
Opiates: NOT DETECTED
Tetrahydrocannabinol: NOT DETECTED

## 2018-09-30 LAB — COMPREHENSIVE METABOLIC PANEL
ALT: 22 U/L (ref 0–44)
AST: 22 U/L (ref 15–41)
Albumin: 4.6 g/dL (ref 3.5–5.0)
Alkaline Phosphatase: 68 U/L (ref 38–126)
Anion gap: 10 (ref 5–15)
BUN: 15 mg/dL (ref 6–20)
CHLORIDE: 102 mmol/L (ref 98–111)
CO2: 26 mmol/L (ref 22–32)
Calcium: 9.5 mg/dL (ref 8.9–10.3)
Creatinine, Ser: 1 mg/dL (ref 0.61–1.24)
GFR calc non Af Amer: >60 mL/min (ref >60)
Glucose, Bld: 92 mg/dL (ref 70–99)
Potassium: 3.6 mmol/L (ref 3.5–5.1)
Sodium: 138 mmol/L (ref 135–145)
Total Bilirubin: 0.5 mg/dL (ref 0.3–1.2)
Total Protein: 8 g/dL (ref 6.5–8.1)

## 2018-09-30 LAB — ACETAMINOPHEN LEVEL: Acetaminophen (Tylenol), Serum: <10 ug/mL — ABNORMAL LOW (ref 10–30)

## 2018-09-30 LAB — CBC
HCT: 40 % (ref 39.0–52.0)
Hemoglobin: 13 g/dL (ref 13.0–17.0)
MCH: 29.3 pg (ref 26.0–34.0)
MCHC: 32.5 g/dL (ref 30.0–36.0)
MCV: 90.3 fL (ref 80.0–100.0)
PLATELETS: 387 10*3/uL (ref 150–400)
RBC: 4.43 MIL/uL (ref 4.22–5.81)
RDW: 13.5 % (ref 11.5–15.5)
WBC: 10.4 10*3/uL (ref 4.0–10.5)
nRBC: 0 % (ref 0.0–0.2)

## 2018-09-30 LAB — ETHANOL: Alcohol, Ethyl (B): <10 mg/dL (ref <10)

## 2018-09-30 LAB — SALICYLATE LEVEL

## 2018-09-30 NOTE — ED Notes (Signed)
Pt ambulatory w/o difficulty from triage 

## 2018-09-30 NOTE — ED Provider Notes (Addendum)
Patient reports that he thinks someone may be out to kill him though he has no idea of any specific person.  He is also feeling suicidal.  Plan is [place] down [place] the railroad tracks patient is alert nontoxic Glasgow Coma Score 15 appears paranoid and mildly anxious.   Doug Sou, MD 09/30/18 1226 Patient is medically cleared .  TTS consulted    Doug Sou, MD 09/30/18 [phone]

## 2018-09-30 NOTE — ED Notes (Signed)
Bed: WBH37 Expected date:  Expected time:  Means of arrival:  Comments: Triage 4  

## 2018-09-30 NOTE — ED Notes (Signed)
tss into see

## 2018-09-30 NOTE — Discharge Instructions (Signed)
For your mental health needs, you are advised to follow up with [facility].  New and returning patients are seen at their walk-in clinic.  Walk-in hours are Monday - Friday from 8:00 am - 3:00 pm.  Walk-in patients are seen on a first come, first served basis.  Try to arrive as early as possible for he best chance of being seen the same day: ° °     [facility] °     [address] °     [place], [place] [address] °     [phone] °

## 2018-09-30 NOTE — BH Assessment (Signed)
[facility] Assessment Progress Note  Per Elta Guadeloupe, FNP, this pt does not require psychiatric hospitalization at this time.  Pt is to be discharged from [facility] with recommendation to follow up with [facility].  This has been included in pt's discharge instructions.  Pt's nurse, Wille Celeste, has been notified.  Doylene Canning, MA Triage Specialist [phone]

## 2018-09-30 NOTE — ED Notes (Signed)
Pt alert/oriented, denies si/hi/avh at this time. Written dc instructions reviewed with pt.  Pt encouraged to follow up with [facility] as instructed and take his medications as directed.  Pt also encouraged to seek treatment for any changes in thoughts or urges.  Pt verbalized understanding.  PT ambulatory w/o difficulty to dc area, belongings returned after leaving the area.

## 2018-09-30 NOTE — ED Notes (Signed)
Bed: WLPT4 Expected date:  Expected time:  Means of arrival:  Comments: 

## 2018-09-30 NOTE — ED Triage Notes (Signed)
Pt reports that he stays at [facility] and hasnt been able to sleep in past couple days. Reports feels someone is out to kill him. Pt also suicidal with plan to [place] railroad tracks.
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
## 2018-09-30 NOTE — Progress Notes (Addendum)
Patient ID: Anthony Cunningham, male   DOB: May 26, 1976, 43 y.o.   MRN: [id number]  Pt was seen and chart reviewed with treatment team and Dr Sharma Covert. Pt was recently discharged from [facility] for the same presentation. Pt has had his medications within the past week and was given prescriptions upon discharge. Pt was instructed to follow up at [facility] for medication management. Pt will again be instructed to follow up at [facility] and/or [facility] for medication management. Pt is not homeless and has been living at [facility]. Pt was discharged from [facility] on 09-24-2018 (please see previous notes in chart) and showed up in this hospital on 09-25-2018 stating he had not had medications in a long time and he was homeless. Pt is psychiatrically clear to follow up with the outpatient resources in his AVS.   Laveda Abbe, NP-C 09-30-2018        1333  Patient's chart reviewed and case discussed with the physician extender and developed treatment plan. Reviewed the information documented and agree with the treatment plan.  Juanetta Beets, DO 09/30/18 6:02 PM
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
## 2018-09-30 NOTE — ED Provider Notes (Signed)
[facility]-EMERGENCY DEPT Provider Note   CSN: [id number] Arrival date & time: 09/30/18  1140     History   Chief Complaint Chief Complaint  Patient presents with  . Suicidal  . Paranoid    HPI Anthony Cunningham is a 43 y.o. male with a PMH of schizophrenia, substance induced mood disorder, depression, and substance abuse presenting with paranoia and suicidal ideations onset 3 days ago. Patient reports he has not slept in 3 days due to concerns that someone is trying to kill him. Patient reports he does not know the people trying kill him and states they are everywhere. Patient reports he also has visual hallucinations of red and black ghosts. Patient denies auditory hallucinations. Patient reports [place] Patient states he has attempted suicide a few months ago by stepping into traffic. Patient reports he is still currently having suicidal ideations with a plan to [place]. Patient states he has been depressed for a few months. Patient reports HI against the people that are trying to harm him. Patient denies a plan. Patient states he has not been taking his medications. Patient denies alcohol or drug use. Patient states he stays at [employer]. Patient denies any other acute complaints.  HPI  No past medical history on file.  Patient Active Problem List   Diagnosis Date Noted  . Cocaine dependence, continuous abuse (HCC) 09/05/2018  . Substance induced mood disorder (HCC) 09/05/2018  . History of major depression 09/05/2018  . History of schizophrenia 09/05/2018    No past surgical history on file.      Home Medications    Prior to Admission medications   Medication Sig Start Date End Date Taking? Authorizing Provider  ARIPiprazole (ABILIFY IM) Inject 1 Dose into the muscle every 30 (thirty) days.    [provider]  benztropine (COGENTIN) 1 MG tablet Take 1 tablet (1 mg  total) by mouth daily. 09/26/18   Laveda Abbe, NP  divalproex (DEPAKOTE) 500 MG DR tablet Take 1 tablet (500 mg total) by mouth 2 (two) times daily. 09/26/18   Laveda Abbe, NP  haloperidol (HALDOL) 10 MG tablet Take 1 tablet (10 mg total) by mouth at bedtime. 09/26/18   Laveda Abbe, NP  hydrOXYzine (VISTARIL) 25 MG capsule Take 25 mg by mouth every 6 (six) hours as needed for anxiety.    [provider]  LITHIUM PO Take 1 capsule by mouth.    [provider]  naltrexone (DEPADE) 50 MG tablet Take 50 mg by mouth daily.    [provider]  QUEtiapine (SEROQUEL) 300 MG tablet Take 300 mg by mouth at bedtime.    [provider]  traZODone (DESYREL) 100 MG tablet Take 100 mg by mouth at bedtime.    [provider]    Family History No family history on file.  Social History Social History   Tobacco Use  . Smoking status: Current Every Day Smoker    Packs/day: 1.50    Types: Cigarettes  . Smokeless tobacco: Never Used  Substance Use Topics  . Alcohol use: Not Currently  . Drug use: Not Currently    Types: Cocaine    Comment: smoked marijuana today.     Allergies   Ibuprofen   Review of Systems Review of Systems  Constitutional: Negative for activity change, appetite change, fatigue, fever and unexpected weight change.  Respiratory: Negative for shortness of breath.  Cardiovascular: Negative for chest pain.  Gastrointestinal: Negative for abdominal pain, nausea and vomiting.  Endocrine: Negative for cold intolerance and heat intolerance.  Genitourinary: Negative for dysuria.  Musculoskeletal: Negative for back pain.  Skin: Negative for rash.  Allergic/Immunologic: Negative for immunocompromised state.  Neurological: Negative for dizziness, weakness and headaches.  Psychiatric/Behavioral: Positive for dysphoric mood, hallucinations, sleep disturbance and suicidal ideas. Negative for agitation, behavioral  problems, confusion, decreased concentration and self-injury. The patient is nervous/anxious. The patient is not hyperactive.      Physical Exam Updated Vital Signs BP 123/83 (BP Location: Left Arm)   Pulse 77   Temp 98.2 F (36.8 C) (Oral)   Resp 17   SpO2 98%   Physical Exam Vitals signs and nursing note reviewed.  Constitutional:      General: He is not in acute distress.    Appearance: He is well-developed. He is not diaphoretic.  HENT:     Head: Normocephalic and atraumatic.     Mouth/Throat:     Mouth: Mucous membranes are moist.     Pharynx: No oropharyngeal exudate or posterior oropharyngeal erythema.  Cardiovascular:     Rate and Rhythm: Normal rate and regular rhythm.     Heart sounds: Normal heart sounds. No murmur. No friction rub. No gallop.   Pulmonary:     Effort: Pulmonary effort is normal. No respiratory distress.     Breath sounds: Normal breath sounds. No wheezing or rales.  Abdominal:     Palpations: Abdomen is soft.     Tenderness: There is no abdominal tenderness.  Musculoskeletal: Normal range of motion.  Skin:    General: Skin is warm.     Findings: No erythema, lesion or rash.  Neurological:     Mental Status: He is alert and oriented to person, place, and time.  Psychiatric:        Attention and Perception: He is attentive. He perceives visual hallucinations.        Mood and Affect: Mood is anxious and depressed. Affect is not labile, blunt, angry or inappropriate.        Speech: Speech normal.        Behavior: Behavior normal.        Thought Content: Thought content is paranoid and delusional. Thought content includes homicidal and suicidal ideation. Thought content includes suicidal plan. Thought content does not include homicidal plan.        Cognition and Memory: Cognition normal.        Judgment: Judgment is impulsive.   Mental Status:  Alert, oriented, thought content appropriate, able to give a coherent history. Speech fluent without  evidence of aphasia. Able to follow 2 step commands without difficulty.  Cranial Nerves:  II:  Peripheral visual fields grossly normal, pupils equal, round, reactive to light III,IV, VI: ptosis not present, extra-ocular motions intact bilaterally  V,VII: smile symmetric, facial light touch sensation equal VIII: hearing grossly normal to voice  X: uvula elevates symmetrically  XI: bilateral shoulder shrug symmetric and strong XII: midline tongue extension without fassiculations Motor:  Normal tone. 5/5 in upper and lower extremities bilaterally including strong and equal grip strength and dorsiflexion/plantar flexion Sensory: Pinprick and light touch normal in all extremities.  Deep Tendon Reflexes: 2+ and symmetric in the biceps and patella Cerebellar: normal finger-to-nose with bilateral upper extremities Gait: normal gait and balance.  Negative pronator drift. Negative Romberg sign. CV: distal pulses palpable throughout     ED Treatments / Results  Labs (all labs  ordered are listed, but only abnormal results are displayed) Labs Reviewed  ACETAMINOPHEN LEVEL - Abnormal; Notable for the following components:      Result Value   Acetaminophen (Tylenol), Serum <10 (*)    All other components within normal limits  COMPREHENSIVE METABOLIC PANEL  ETHANOL  SALICYLATE LEVEL  CBC  RAPID URINE DRUG SCREEN, HOSP PERFORMED    EKG None  Radiology No results found.  Procedures Procedures (including critical care time)  Medications Ordered in ED Medications - No data to display   Initial Impression / Assessment and Plan / ED Course  I have reviewed the triage vital signs and the nursing notes.  Pertinent labs & imaging results that were available during my care of the patient were reviewed by me and considered in my medical decision making (see chart for details).  Clinical Course as of Oct 01 1303  Wed Sep 30, 2018  1304 UDS negative. Labs unremarkable.  Rapid urine drug  screen (hospital performed) [AH]    Clinical Course User Index [AH] Leretha DykesHernandez, Zeric Baranowski P, PA-C   Patient presents to the ER with a number of risk factors for suicide for example the patient has a history of Depression, substance abuse, insomnia, and past suicide attempts. Patient also presents with visual hallucinations and paranoia. Suspect symptoms are likely due to not taking medications and history of schizophrenia. Current Plan is to have patient be evaluated by TTS for further assessment on weather or not to be placed inpatient.  We have discussed that If the patient feels he was becoming unsafe, instead of acting on an impulse of self harm he will contact the crisis line, speak to friends about it, or return to the emergency department. [facility] assessment.    Final Clinical Impressions(s) / ED Diagnoses   Final diagnoses:  Suicidal ideation  Paranoia [facility](HCC)  Visual hallucinations    ED Discharge Orders    None       Leretha DykesHernandez, Lechelle Wrigley P, [place]PA-C 09/30/18 1310    Doug SouJacubowitz, Sam, MD 09/30/18 [phone]

## 2018-10-05 ENCOUNTER — Emergency Department (HOSPITAL_COMMUNITY)
Admission: EM | Admit: 2018-10-05 | Discharge: 2018-10-06 | Disposition: A | Discharge status: Other Acute Inpatient Hospital | Payer: Medicaid Other | Attending: Emergency Medicine | Admitting: Emergency Medicine

## 2018-10-05 DIAGNOSIS — F17210 Nicotine dependence, cigarettes, uncomplicated: Secondary | ICD-10-CM | POA: Diagnosis not present

## 2018-10-05 DIAGNOSIS — F209 Schizophrenia, unspecified: Secondary | ICD-10-CM

## 2018-10-05 DIAGNOSIS — Z7289 Other problems related to lifestyle: Secondary | ICD-10-CM

## 2018-10-05 DIAGNOSIS — R45851 Suicidal ideations: Secondary | ICD-10-CM | POA: Insufficient documentation

## 2018-10-05 DIAGNOSIS — Z79899 Other long term (current) drug therapy: Secondary | ICD-10-CM | POA: Diagnosis not present

## 2018-10-05 DIAGNOSIS — T1491XA Suicide attempt, initial encounter: Secondary | ICD-10-CM

## 2018-10-05 DIAGNOSIS — F251 Schizoaffective disorder, depressive type: Secondary | ICD-10-CM | POA: Insufficient documentation

## 2018-10-05 DIAGNOSIS — R440 Auditory hallucinations: Secondary | ICD-10-CM | POA: Diagnosis present

## 2018-10-05 DIAGNOSIS — F1420 Cocaine dependence, uncomplicated: Secondary | ICD-10-CM | POA: Insufficient documentation

## 2018-10-05 LAB — COMPREHENSIVE METABOLIC PANEL
ALT: 21 U/L (ref 0–44)
AST: 22 U/L (ref 15–41)
Albumin: 4.2 g/dL (ref 3.5–5.0)
Alkaline Phosphatase: 64 U/L (ref 38–126)
Anion gap: 9 (ref 5–15)
BUN: 14 mg/dL (ref 6–20)
CO2: 24 mmol/L (ref 22–32)
Calcium: 9.4 mg/dL (ref 8.9–10.3)
Chloride: 105 mmol/L (ref 98–111)
Creatinine, Ser: 1.04 mg/dL (ref 0.61–1.24)
GFR calc Af Amer: >60 mL/min (ref >60)
Glucose, Bld: 69 mg/dL — ABNORMAL LOW (ref 70–99)
Potassium: 3.9 mmol/L (ref 3.5–5.1)
Sodium: 138 mmol/L (ref 135–145)
Total Bilirubin: 0.6 mg/dL (ref 0.3–1.2)
Total Protein: 7.6 g/dL (ref 6.5–8.1)

## 2018-10-05 LAB — RAPID URINE DRUG SCREEN, HOSP PERFORMED
Amphetamines: NOT DETECTED
Barbiturates: NOT DETECTED
Benzodiazepines: NOT DETECTED
Cocaine: NOT DETECTED
Opiates: NOT DETECTED
Tetrahydrocannabinol: NOT DETECTED

## 2018-10-05 LAB — CBC
HCT: 42 % (ref 39.0–52.0)
Hemoglobin: 13.7 g/dL (ref 13.0–17.0)
MCH: 29.3 pg (ref 26.0–34.0)
MCHC: 32.6 g/dL (ref 30.0–36.0)
MCV: 89.7 fL (ref 80.0–100.0)
Platelets: 439 10*3/uL — ABNORMAL HIGH (ref 150–400)
RBC: 4.68 MIL/uL (ref 4.22–5.81)
RDW: 14.2 % (ref 11.5–15.5)
WBC: 10.3 10*3/uL (ref 4.0–10.5)
nRBC: 0 % (ref 0.0–0.2)

## 2018-10-05 LAB — ACETAMINOPHEN LEVEL: Acetaminophen (Tylenol), Serum: <10 ug/mL — ABNORMAL LOW (ref 10–30)

## 2018-10-05 LAB — SALICYLATE LEVEL: Salicylate Lvl: <7 mg/dL (ref 2.8–30.0)

## 2018-10-05 LAB — ETHANOL: Alcohol, Ethyl (B): <10 mg/dL (ref <10)
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
## 2018-10-05 NOTE — ED Notes (Signed)
Pt signed medical clearance pt policy and agreed to terms

## 2018-10-05 NOTE — ED Notes (Addendum)
Pt settled in room, meal given

## 2018-10-05 NOTE — ED Notes (Addendum)
Pt stated his plan to end his life would be to stab himself with a pen, walk in front of traffic, or let a train hit him. Pt states that he has not taken his medications since the beginning of January.

## 2018-10-05 NOTE — ED Triage Notes (Signed)
Pt here from [place] house stating that he wants to kill himself , hx of same

## 2018-10-05 NOTE — ED Notes (Signed)
PT given snack

## 2018-10-05 NOTE — ED Notes (Signed)
Upon assessment patient has a cut to left wrist from previously trying to stab himself. Bleeding controlled.

## 2018-10-05 NOTE — ED Notes (Signed)
Pt wanded by security. 

## 2018-10-05 NOTE — ED Notes (Signed)
Pt's belongings have been moved from triage to [place] [facility] at this time. Pt's suitcase is too big to fit in locker with his bagged belongings so it is next to locker. No items have been inventoried at this time. [facility] staff aware.
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
## 2018-10-05 NOTE — ED Notes (Signed)
Pt wanded by security at 16:30. Sitter at bedside at this time.

## 2018-10-05 NOTE — ED Notes (Signed)
Pt given turkey sandwich

## 2018-10-06 ENCOUNTER — Encounter (HOSPITAL_COMMUNITY): Payer: Self-pay | Admitting: Registered Nurse

## 2018-10-06 ENCOUNTER — Other Ambulatory Visit: Payer: Self-pay

## 2018-10-06 MED ORDER — QUETIAPINE FUMARATE 50 MG PO TABS
300.0000 mg | ORAL_TABLET | Freq: Every day | ORAL | Status: DC
  Administered 2018-10-06: 300 mg via ORAL
  Filled 2018-10-06: qty 2

## 2018-10-06 MED ORDER — ACETAMINOPHEN 325 MG PO TABS: 650.0000 mg | ORAL_TABLET | ORAL | Status: DC | PRN

## 2018-10-06 MED ORDER — ZOLPIDEM TARTRATE 5 MG PO TABS: 5.0000 mg | ORAL_TABLET | Freq: Every evening | ORAL | Status: DC | PRN

## 2018-10-06 MED ORDER — ONDANSETRON HCL 4 MG PO TABS: 4.0000 mg | ORAL_TABLET | Freq: Three times a day (TID) | ORAL | Status: DC | PRN

## 2018-10-06 MED ORDER — HYDROXYZINE PAMOATE 25 MG PO CAPS
25.0000 mg | ORAL_CAPSULE | Freq: Four times a day (QID) | ORAL | Status: DC | PRN
  Filled 2018-10-06: qty 1

## 2018-10-06 MED ORDER — NICOTINE 21 MG/24HR TD PT24
21.0000 mg | MEDICATED_PATCH | Freq: Every day | TRANSDERMAL | Status: DC
  Filled 2018-10-06: qty 1

## 2018-10-06 MED ORDER — NALTREXONE HCL 50 MG PO TABS
50.0000 mg | ORAL_TABLET | Freq: Every day | ORAL | Status: DC
  Administered 2018-10-06: 50 mg via ORAL
  Filled 2018-10-06: qty 1

## 2018-10-06 MED ORDER — TRAZODONE HCL 100 MG PO TABS
100.0000 mg | ORAL_TABLET | Freq: Every day | ORAL | Status: DC
  Administered 2018-10-06: 100 mg via ORAL
  Filled 2018-10-06: qty 1

## 2018-10-06 MED ORDER — LORAZEPAM 1 MG PO TABS: 1.0000 mg | ORAL_TABLET | ORAL | Status: DC | PRN

## 2018-10-06 MED ORDER — BENZTROPINE MESYLATE 1 MG PO TABS
1.0000 mg | ORAL_TABLET | Freq: Every day | ORAL | Status: DC
  Administered 2018-10-06: 1 mg via ORAL
  Filled 2018-10-06: qty 1

## 2018-10-06 MED ORDER — DIVALPROEX SODIUM 250 MG PO DR TAB
500.0000 mg | DELAYED_RELEASE_TABLET | Freq: Two times a day (BID) | ORAL | Status: DC
  Administered 2018-10-06 (×2): 500 mg via ORAL
  Filled 2018-10-06 (×2): qty 2

## 2018-10-06 MED ORDER — HALOPERIDOL 5 MG PO TABS
10.0000 mg | ORAL_TABLET | Freq: Every day | ORAL | Status: DC
  Administered 2018-10-06: 10 mg via ORAL
  Filled 2018-10-06: qty 2

## 2018-10-06 MED ORDER — ZIPRASIDONE MESYLATE 20 MG IM SOLR: 20.0000 mg | INTRAMUSCULAR | Status: DC | PRN

## 2018-10-06 MED ORDER — OLANZAPINE 5 MG PO TBDP: 10.0000 mg | ORAL_TABLET | Freq: Three times a day (TID) | ORAL | Status: DC | PRN

## 2018-10-06 NOTE — ED Notes (Signed)
IVC papers served - copy faxed to [facility] - copy sent to Medical Records, original placed in folder for Magistrate and all 3 sets on clipboard.

## 2018-10-06 NOTE — ED Notes (Signed)
Pt in shower.  

## 2018-10-06 NOTE — ED Notes (Signed)
ALL belongings - 1 Large Black Suitcase, 6 Labeled Belongings Bags, and 1 Valuables Envelope - Deputy - Pt aware.

## 2018-10-06 NOTE — Progress Notes (Signed)
Pt accepted to [facility]   Dr Johnnye Sima is the accepting/attending provider.  Call report to [phone] Becky @ [facility] ED notified.   Pt is to be IVC'd prior to transport  Pt may be transported by [facility] Pt scheduled  to arrive at [facility] as soon as IVC is served and transport can be arranged.  PLEASE FAX IVC TO [facility] [phone]) WHEN SERVED.

## 2018-10-06 NOTE — ED Notes (Signed)
Telepsych being performed. 

## 2018-10-06 NOTE — ED Notes (Signed)
IVC papers faxed to Magistrate - verified receipt.  

## 2018-10-06 NOTE — ED Notes (Signed)
IVC papers placed in office for Dr Rodena Medin to complete.

## 2018-10-06 NOTE — Consult Note (Signed)
  Tele Assessment   Anthony Cunningham, 43 y.o., male patient presented to [facility] with complaints of suicidal ideation.  Patient seen via telepsych by this provider; chart reviewed and consulted with Dr. Lucianne Muss on 10/06/18.  Patient has been seen in ED several times with same complaint.  Patient was discharged from [facility] 09/25/18 and presented to [facility] 09/25/18 and 09/30/18 with same complaint and stating that he hasn't had his medications. Patient was also discharged from [facility] 08/2018.  Patient has not follow up with any referrals for medication management at [facility] or [facility] [place].  On evaluation Anthony Cunningham reports he is hearing voices and having racing thoughts.  Patient states that he did Follow up with [facility] 1 month ago for an intake and they was suppose to contact him for medication management 2 weeks after his visit but he hasn't heard anything.  States he has been working with [place] at [facility] but she can't get him his medication.  "I was in the hospital at [facility] and they really helped me; but I haven't been able to get my medicine.  I need my medicine to get these voices to stop.  If I can't get these voices to stop I'm telling you I'm going to kill myself.  I need to get back on my medicine.  I went to the hospital last week they gave me a shot of Ativan and put me back on the street.  I called [facility] myself cause they really helped me and they said they had a bed for me."  Patient denies homicidal ideation and paranoia.  Patient continues to endorse suicidal ideation with plan to walk into traffic or find a way to kill himself.    During evaluation Anthony Cunningham is sitting on side of bed; he is alert/oriented x 4; calm/cooperative; and mood congruent with affect.  Patient is speaking in a clear tone at moderate volume, and normal pace; with good eye contact.  His thought process is coherent and relevant;  He reports that he is hearing voices and having racing thoughts and  being off of his medications.  Patient denies homicidal ideation, and paranoia.  Patient has remained calm throughout assessment and has answered questions appropriately.     Recommendations:  Inpatient psychiatric treatment  Disposition: Recommend psychiatric Inpatient admission when medically cleared.   Spoke with [place], [place]; informed of above recommendation and disposition   Assunta Found, NP

## 2018-10-06 NOTE — ED Provider Notes (Signed)
Assessed pt this AM. He is resting comfortably in bed in NAD. He is easily arousable. He has no new complaints today. VSS.   He is awaiting TSS reassessment this AM. Dispo per psych team.   Karrie MeresCouture, Sasan Wilkie S, PA-C 10/06/18 [id number]    Alvira MondaySchlossman, Erin, MD 10/08/18 [id number]

## 2018-10-06 NOTE — ED Notes (Signed)
Pt given snack. 

## 2019-09-17 IMAGING — CR DG CHEST 2V
2 series · 2 of 2 positions shown · non-contrast
Comparison: None.

CLINICAL DATA: Chest pain.

EXAM:
CHEST - 2 VIEW

[chest pa]
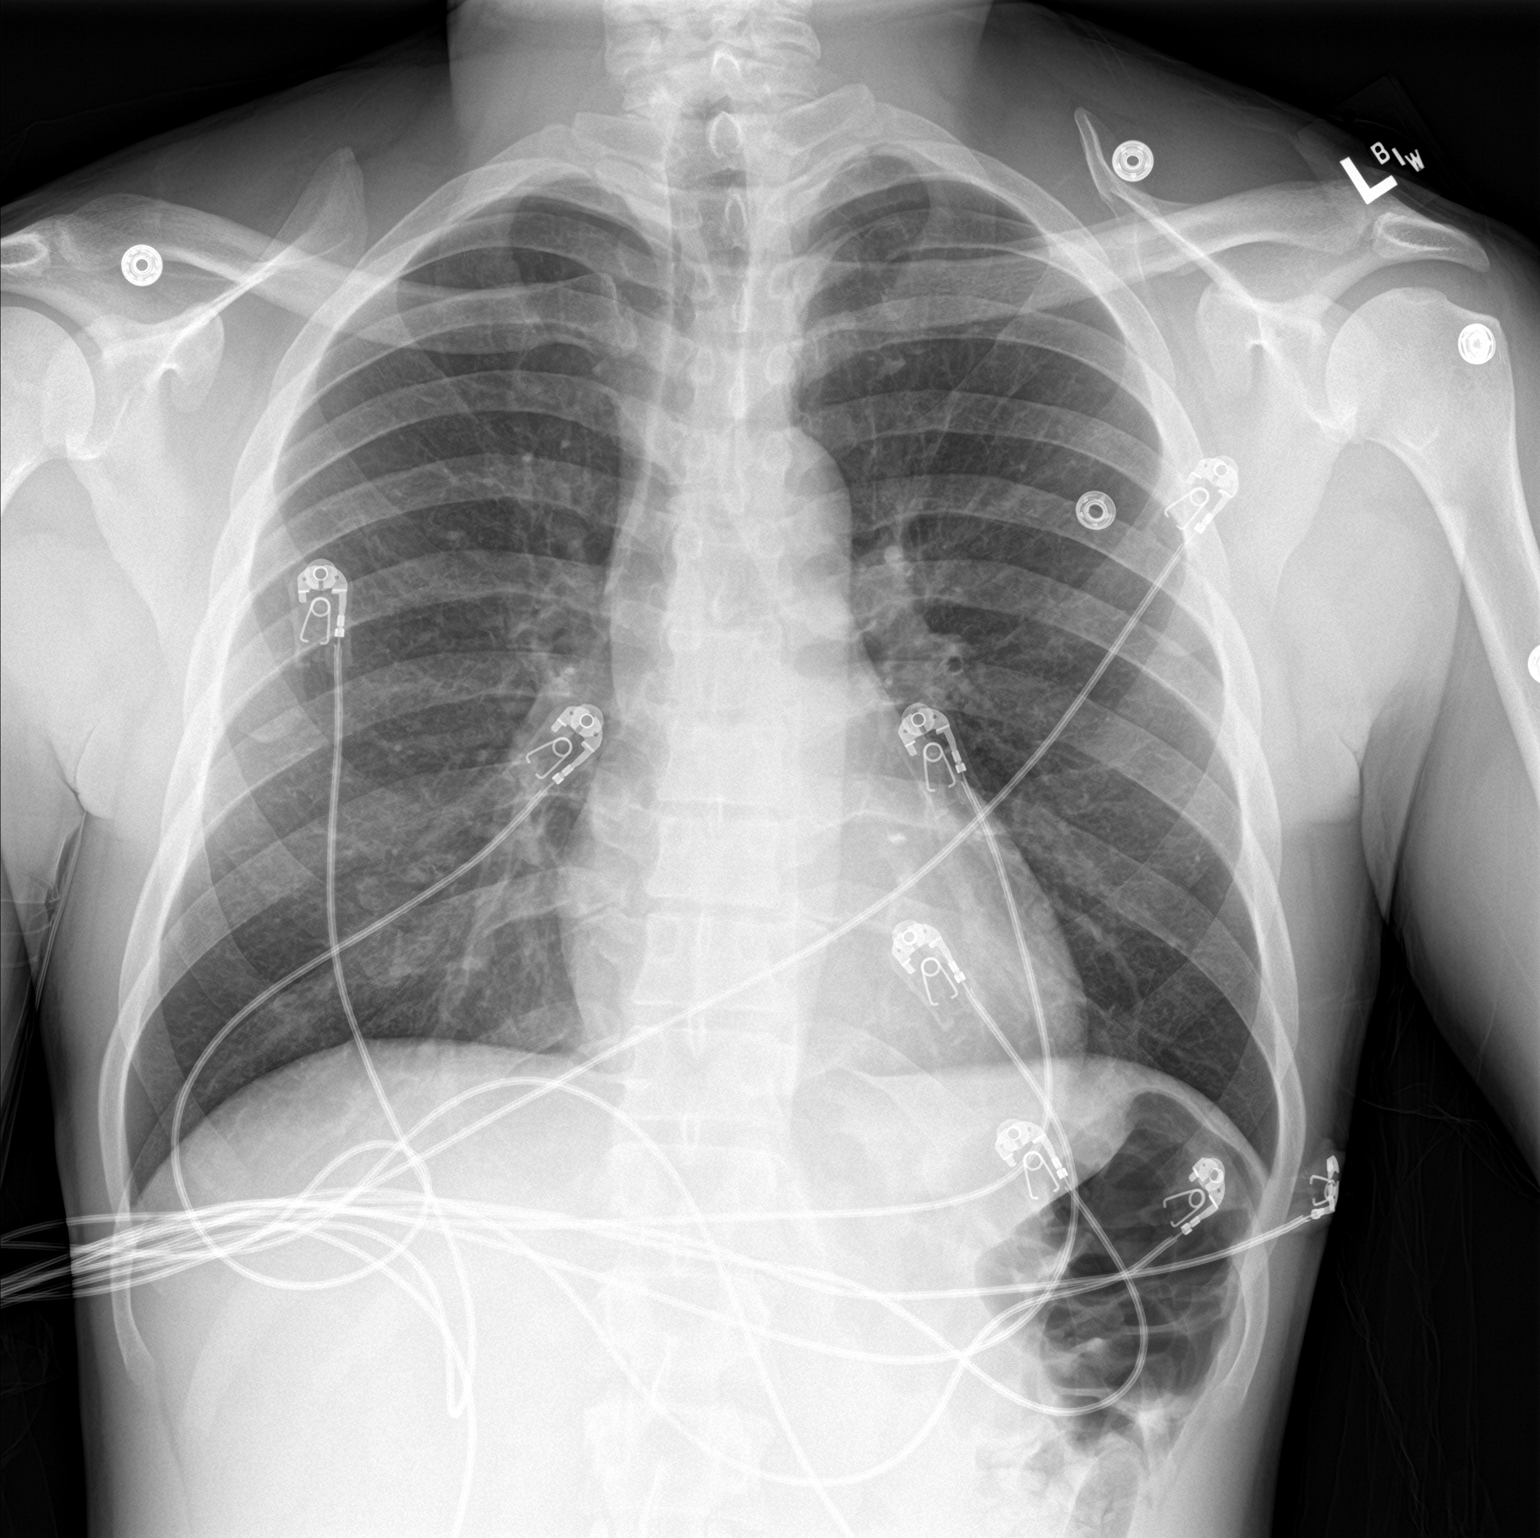

[chest lat]
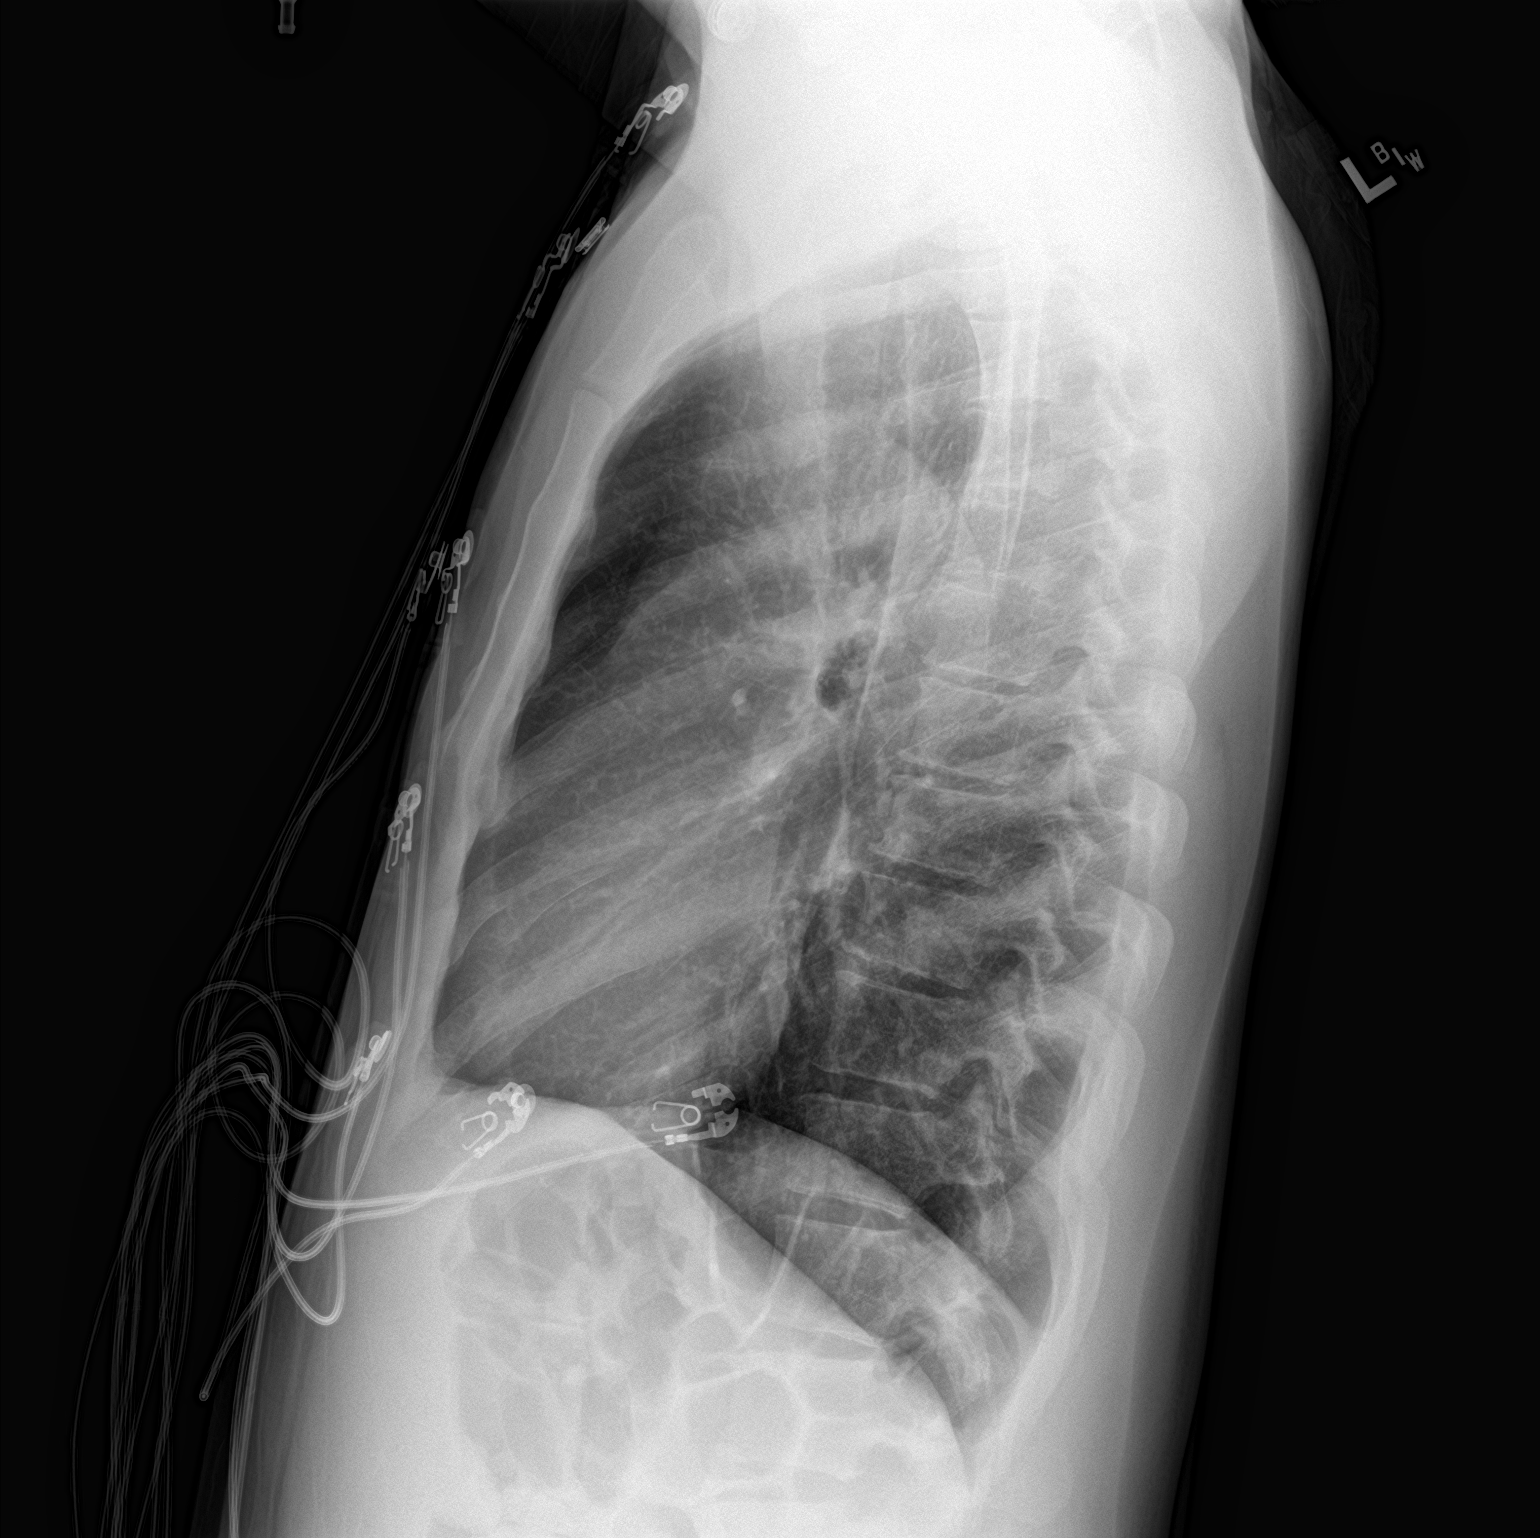

[2 of 2 positions shown; findings below may reference images not displayed]

FINDINGS: The heart size and mediastinal contours are within normal limits.
Both lungs are clear. The visualized skeletal structures are
unremarkable.
IMPRESSION: No active cardiopulmonary disease.
